# Patient Record
Sex: Female | Born: 1960 | Hispanic: No | Marital: Married | Smoking: Never smoker
Health system: Southern US, Community
[De-identification: ages and names within clinical notes are randomized; demographics above are authoritative.]

## PROBLEM LIST (undated history)

## (undated) DIAGNOSIS — D649 Anemia, unspecified: Secondary | ICD-10-CM

## (undated) DIAGNOSIS — E538 Deficiency of other specified B group vitamins: Secondary | ICD-10-CM

## (undated) DIAGNOSIS — B370 Candidal stomatitis: Secondary | ICD-10-CM

## (undated) DIAGNOSIS — K219 Gastro-esophageal reflux disease without esophagitis: Secondary | ICD-10-CM

## (undated) DIAGNOSIS — T7840XA Allergy, unspecified, initial encounter: Secondary | ICD-10-CM

## (undated) HISTORY — DX: Anemia, unspecified: D64.9

## (undated) HISTORY — DX: Candidal stomatitis: B37.0

## (undated) HISTORY — DX: Allergy, unspecified, initial encounter: T78.40XA

## (undated) HISTORY — DX: Gastro-esophageal reflux disease without esophagitis: K21.9

## (undated) HISTORY — PX: APPENDECTOMY: SHX54

## (undated) HISTORY — PX: UPPER GASTROINTESTINAL ENDOSCOPY: SHX188

## (undated) HISTORY — DX: Deficiency of other specified B group vitamins: E53.8

---

## 2010-02-16 ENCOUNTER — Encounter: Payer: Self-pay | Admitting: Internal Medicine

## 2010-03-21 ENCOUNTER — Encounter: Payer: Self-pay | Admitting: Internal Medicine

## 2010-03-28 ENCOUNTER — Encounter: Payer: Self-pay | Admitting: Internal Medicine

## 2010-04-03 ENCOUNTER — Encounter: Payer: Self-pay | Admitting: Internal Medicine

## 2010-04-09 ENCOUNTER — Encounter: Payer: Self-pay | Admitting: Internal Medicine

## 2010-04-16 ENCOUNTER — Encounter: Payer: Self-pay | Admitting: Internal Medicine

## 2010-04-18 ENCOUNTER — Encounter (INDEPENDENT_AMBULATORY_CARE_PROVIDER_SITE_OTHER): Payer: Self-pay | Admitting: *Deleted

## 2010-04-24 ENCOUNTER — Telehealth: Payer: Self-pay | Admitting: Internal Medicine

## 2010-04-26 DIAGNOSIS — K219 Gastro-esophageal reflux disease without esophagitis: Secondary | ICD-10-CM

## 2010-04-26 DIAGNOSIS — J301 Allergic rhinitis due to pollen: Secondary | ICD-10-CM | POA: Insufficient documentation

## 2010-04-26 DIAGNOSIS — D509 Iron deficiency anemia, unspecified: Secondary | ICD-10-CM

## 2010-04-26 DIAGNOSIS — B370 Candidal stomatitis: Secondary | ICD-10-CM | POA: Insufficient documentation

## 2010-04-26 DIAGNOSIS — R1319 Other dysphagia: Secondary | ICD-10-CM

## 2010-04-27 ENCOUNTER — Ambulatory Visit: Payer: Self-pay | Admitting: Internal Medicine

## 2010-05-10 ENCOUNTER — Ambulatory Visit: Payer: Self-pay | Admitting: Internal Medicine

## 2010-05-15 ENCOUNTER — Encounter: Payer: Self-pay | Admitting: Internal Medicine

## 2010-06-04 ENCOUNTER — Emergency Department (HOSPITAL_COMMUNITY): Admission: EM | Admit: 2010-06-04 | Discharge: 2010-06-04 | Payer: Self-pay | Admitting: Emergency Medicine

## 2010-06-04 ENCOUNTER — Encounter (INDEPENDENT_AMBULATORY_CARE_PROVIDER_SITE_OTHER): Payer: Self-pay | Admitting: *Deleted

## 2010-06-05 ENCOUNTER — Telehealth: Payer: Self-pay | Admitting: Internal Medicine

## 2010-06-07 ENCOUNTER — Ambulatory Visit (HOSPITAL_COMMUNITY): Admission: RE | Admit: 2010-06-07 | Discharge: 2010-06-07 | Payer: Self-pay | Admitting: Internal Medicine

## 2010-06-07 ENCOUNTER — Telehealth: Payer: Self-pay | Admitting: Internal Medicine

## 2010-06-14 ENCOUNTER — Ambulatory Visit: Payer: Self-pay | Admitting: Internal Medicine

## 2010-06-14 ENCOUNTER — Encounter (INDEPENDENT_AMBULATORY_CARE_PROVIDER_SITE_OTHER): Payer: Self-pay | Admitting: *Deleted

## 2010-06-19 ENCOUNTER — Ambulatory Visit: Payer: Self-pay | Admitting: Internal Medicine

## 2011-01-08 NOTE — Letter (Signed)
Summary: [name] MD  [name] MD   Imported By: Sherian Rein 04/26/2010 11:18:10  _____________________________________________________________________  External Attachment:    Type:   Image     Comment:   External Document

## 2011-01-08 NOTE — Procedures (Signed)
Summary: Upper Endoscopy  Patient: Jackie Powell Note: All result statuses are Final unless otherwise noted.  Tests: (1) Upper Endoscopy (EGD)   EGD Upper Endoscopy       DONE     [facility]     [address]     [place], [place]  [address]           ENDOSCOPY PROCEDURE REPORT           PATIENT:  Jackie Powell, Jackie Powell  MR#:  [id number]     BIRTHDATE:  [birth year], 49 yrs. old  GENDER:  female           ENDOSCOPIST:  Hedwig Morton. Juanda Chance, MD     Referred by:  Gareth Morgan, M.D.           PROCEDURE DATE:  06/19/2010     PROCEDURE:  EGD, diagnostic, Maloney Dilation of Esophagus     ASA CLASS:  Class I     INDICATIONS:  abnl Ba esophagram, B12 def, Iron def,     EGD 05/2010 with dil 49F helped for a short time           MEDICATIONS:   Versed 10 mg, Fentanyl 100 mcg     TOPICAL ANESTHETIC:  Exactacain Spray           DESCRIPTION OF PROCEDURE:   After the risks benefits and     alternatives of the procedure were thoroughly explained, informed     consent was obtained.  The [facility] GIF-H180 [id number] endoscope was     introduced through the mouth and advanced to the second portion of     the duodenum, without limitations.  The instrument was slowly     withdrawn as the mucosa was fully examined.     <<PROCEDUREIMAGES>>           The upper, middle, and distal third of the esophagus were     carefully inspected and no abnormalities were noted. The z-line     was well seen at the GEJ. The endoscope was pushed into the fundus     which was normal including a retroflexed view. The antrum,gastric     body, first and second part of the duodenum were unremarkable (see     image1, image2, image4, image3, image5, image6, image7, image8,     and image9).  maloney dilator 10F Elease Hashimoto passed without     difficulty    Retroflexed views revealed no abnormalities.    The     scope was then withdrawn from the patient and the procedure     completed.           COMPLICATIONS:  None        ENDOSCOPIC IMPRESSION:     1) Normal EGD     s/p passage of 10F maloney dilator     RECOMMENDATIONS:     post dil orders,     consider neuro consultation to r/o neurogenic dysphagia, brother     with ALS           REPEAT EXAM:  In 0 year(s) for.           ______________________________     Hedwig Morton. Juanda Chance, MD           CC:           n.     eSIGNED:   Hedwig Morton. Darey Hershberger at 06/19/2010 05:39 PM           Jackie Powell,  [id number]  Note: An exclamation mark (!) indicates a result that was not dispersed into the flowsheet. Document Creation Date: 06/19/2010 5:40 PM _______________________________________________________________________  (1) Order result status: Final Collection or observation date-time: 06/19/2010 16:51 Requested date-time:  Receipt date-time:  Reported date-time:  Referring Physician:   Ordering Physician: Lina Sar [phone]) Specimen Source:  Source: Launa Grill Order Number: [id number] Lab site:   Appended Document: Upper Endoscopy    Clinical Lists Changes

## 2011-01-08 NOTE — Assessment & Plan Note (Signed)
Summary: DYSPHAGIA...AS.   History of Present Illness Visit Type: consult  Primary GI MD: Lina Sar MD Primary Provider: Mila Homer. Sudie Bailey, MD Requesting Provider: Mila Homer. Sudie Bailey, MD Chief Complaint: GERD, dysphagia, nausea, and constipation  History of Present Illness:   This is a 50 year old white female who comes for solid food dysphagia and pill dysphagia as well as some chronic rhinitis followed by an ear,nose and throat specialist. She has had a 20 pound weight loss and severe iron deficiency anemia. Her hemoglobin was 9 and has gradually increased to 12.4 over the past 3 months. She has been on iron supplements. Patient has menorrhagia due to uterine fibroids. She is followed by a gynecologist. Her B12 level was 288 pg and her iron saturation was 7%. There is no family history of colon cancer. Patient has normal bowel habits and denies any bleeding. She also denies hoarseness or nocturnal cough.   GI Review of Systems    Reports acid reflux, dysphagia with solids, heartburn, and  nausea.      Denies abdominal pain, belching, bloating, chest pain, dysphagia with liquids, loss of appetite, vomiting, vomiting blood, weight loss, and  weight gain.      Reports constipation.     Denies anal fissure, black tarry stools, change in bowel habit, diarrhea, diverticulosis, fecal incontinence, heme positive stool, hemorrhoids, irritable bowel syndrome, jaundice, light color stool, liver problems, rectal bleeding, and  rectal pain.    Current Medications (verified): 1)  Ferretts 325 (106 Fe) Mg Tabs (Ferrous Fumarate) .... One Tablet By Mouth Once Daily 2)  Cyanocobalamin 1000 Mcg/ml Soln (Cyanocobalamin) .... Every Day 3)  Singulair 10 Mg Tabs (Montelukast Sodium) .... As Needed 4)  Afrin Nasal Spray 0.05 % Soln (Oxymetazoline Hcl) .... As Needed  Allergies (verified): 1)  ! Zithromax 2)  ! Codeine  Past History:  Past Medical History: Reviewed history from 04/25/2010 and no  changes required. ALLERGIC RHINITIS, SEASONAL (ICD-477.0) DYSPHAGIA (ICD-787.29) Hx of CANDIDIASIS, ORAL (ICD-112.0) GERD (ICD-530.81) ANEMIA, IRON DEFICIENCY (ICD-280.9)    Past Surgical History: Reviewed history from 04/25/2010 and no changes required. Unremarkable  Family History: No FH of Colon Cancer: Family History of Diabetes: PGGM Family History of Heart Disease: MGM, and Maternal Great Uncle   Social History: Self Employed Married Childern  Patient has never smoked.  Alcohol Use - no Illicit Drug Use - no Daily Caffeine Use: one daily   Review of Systems       The patient complains of allergy/sinus, anxiety-new, fatigue, heart rhythm changes, night sweats, shortness of breath, and sleeping problems.  The patient denies anemia, arthritis/joint pain, back pain, blood in urine, breast changes/lumps, change in vision, confusion, cough, coughing up blood, depression-new, fainting, fever, headaches-new, hearing problems, heart murmur, itching, menstrual pain, muscle pains/cramps, nosebleeds, pregnancy symptoms, skin rash, sore throat, swelling of feet/legs, swollen lymph glands, thirst - excessive , urination - excessive , urination changes/pain, urine leakage, vision changes, and voice change.         Pertinent positive and negative review of systems were noted in the above HPI. All other ROS was otherwise negative.   Vital Signs:  Patient profile:   50 year old female Height:      64 inches Weight:      146 pounds BMI:     25.15 BSA:     1.71 Pulse rate:   72 / minute Pulse rhythm:   regular BP sitting:   116 / 64  (left arm) Cuff size:  regular  Vitals Entered By: Ok Anis CMA (Apr 27, 2010 2:29 PM)  Physical Exam  General:  alert, oriented and in no distress. Eyes:  PERRLA, no icterus. Mouth:  No deformity or lesions, dentition normal. Neck:  Supple; no masses or thyromegaly. Lungs:  Clear throughout to auscultation. Heart:  Regular rate and rhythm; no  murmurs, rubs,  or bruits. Abdomen:  soft nontender abdomen with normoactive bowel sounds. Liver edge 1 cm below right costal margin. No distention. Rectal:  deferred due to patient having her period. Msk:  Symmetrical with no gross deformities. Normal posture. Extremities:  No clubbing, cyanosis, edema or deformities noted. Skin:  Intact without significant lesions or rashes. Psych:  Alert and cooperative. Normal mood and affect.   Impression & Recommendations:  Problem # 1:  ANEMIA, IRON DEFICIENCY (ICD-280.9)  Patient has severe iron deficiency anemia corrected by oral iron supplementation. This is most likely due to menorrhagia. Upper GI symptoms are suggestive of an esophageal stricture, esophageal dysmotility or esophagitis. In the setting of iron deficiency, we need to consider Plummer-Vinston syndrome or an esophageal web. She has a long history of intermittent gastroesophageal reflux . We will proceed with an upper endoscopy and possible dilatation. She will restart PPI's. I have given her samples of AcipHex 20 mg a day since it is a small pill and will pass easier than Prilosec, I have discussed screening for evaluation of iron deficiency anemia. The patient prefers to go throught with upper endoscopy first than we will colonoscopy depending on the findings.  Orders: EGD (EGD)  Problem # 2:  GERD (ICD-530.81)  Patient will need an EGD with possible dilatation. She is to start AcipHex 20 mg daily.  Orders: EGD (EGD)  Patient Instructions: 1)  AcipHex 20 mg daily 2)  Antireflux measures 3)  Continue iron supplements 4)  Upper endoscopy with possible biopsies and dilatation 5)  Consider screening colonoscopy by age 82 6)  Copy sent to :

## 2011-01-08 NOTE — Progress Notes (Signed)
Summary: Sooner Appt.   Phone Note Call from Patient Call back at [facility] Phone [phone]   Caller: Patient Summary of Call: Wants sooner appt. than 05-17-10. Appt. r/s to 04-27-10 at 2:45pm.  Norchel P. in [facility] will advise pt. of new appt. date/time. Pt. instructed to call back as needed.   Initial call taken by: Laureen Ochs LPN,  Apr 24, 2010 4:42 PM

## 2011-01-08 NOTE — Letter (Signed)
Summary: New Patient letter  [facility]  [address] [place], [place] [address]   Phone: [phone]  Fax: [phone]       04/18/2010 MRN: [id number]  Jackie Powell [address] [place], [place]  [address]  Dear Ms. Cibrian,  Welcome to the Gastroenterology Division at [facility].    You are scheduled to see Dr. Jarold Motto on 04/24/2010 at 9:00AM on the 3rd floor at [facility], [address]  We ask that you try to arrive at our office 15 minutes prior to your appointment time to allow for check-in.  We would like you to complete the enclosed self-administered evaluation form prior to your visit and bring it with you on the day of your appointment.  We will review it with you.  Also, please bring a complete list of all your medications or, if you prefer, bring the medication bottles and we will list them.  Please bring your insurance card so that we may make a copy of it.  If your insurance requires a referral to see a specialist, please bring your referral form from your primary care physician.  Co-payments are due at the time of your visit and may be paid by cash, check or credit card.     Your office visit will consist of a consult with your physician (includes a physical exam), any laboratory testing he/she may order, scheduling of any necessary diagnostic testing (e.g. x-ray, ultrasound, CT-scan), and scheduling of a procedure (e.g. Endoscopy, Colonoscopy) if required.  Please allow enough time on your schedule to allow for any/all of these possibilities.    If you cannot keep your appointment, please call [phone] to cancel or reschedule prior to your appointment date.  This allows [place] the opportunity to schedule an appointment for another patient in need of care.  If you do not cancel or reschedule by 5 p.m. the business day prior to your appointment date, you will be charged a $50.00 late cancellation/no-show fee.    Thank you for choosing [facility] for your medical needs.  We appreciate the opportunity to care for you.  Please visit [place] at our website  to learn more about our practice.                     Sincerely,                                                             The Gastroenterology Division

## 2011-01-08 NOTE — Letter (Signed)
Summary: [name] MD  [name] MD   Imported By: Sherian Rein 04/26/2010 11:17:12  _____________________________________________________________________  External Attachment:    Type:   Image     Comment:   External Document

## 2011-01-08 NOTE — Letter (Signed)
Summary: EGD Instructions  [facility]  [address] [place], [place] [address]   Phone: [phone]  Fax: [phone]       Jackie Powell    12-12-1960    MRN: [id number]       Procedure Day /Date:  06/19/10  Tuesday     Arrival Time:  3pm     Procedure Time: 4pm     Location of Procedure:                    _x  _  Endoscopy Center (4th Floor)   PREPARATION FOR ENDOSCOPY   On_ 06/19/10 _ THE DAY OF THE PROCEDURE:  1.   No solid foods, milk or milk products are allowed after midnight the night before your procedure.  2.   Do not drink anything colored red or purple.  Avoid juices with pulp.  No orange juice.  3.  You may drink clear liquids until 2pm, which is 2 hours before your procedure.                                                                                                CLEAR LIQUIDS INCLUDE: Water Jello Ice Popsicles Tea (sugar ok, no milk/cream) Powdered fruit flavored drinks Coffee (sugar ok, no milk/cream) Gatorade Juice: apple, white grape, white cranberry  Lemonade Clear bullion, consomm, broth Carbonated beverages (any kind) Strained chicken noodle soup Hard Candy   MEDICATION INSTRUCTIONS  Unless otherwise instructed, you should take regular prescription medications with a small sip of water as early as possible the morning of your procedure.               OTHER INSTRUCTIONS  You will need a responsible adult at least 49 years of age to accompany you and drive you home.   This person must remain in the waiting room during your procedure.  Wear loose fitting clothing that is easily removed.  Leave jewelry and other valuables at home.  However, you may wish to bring a book to read or an iPod/MP3 player to listen to music as you wait for your procedure to start.  Remove all body piercing jewelry and leave at home.  Total time from sign-in until discharge is approximately 2-3 hours.  You should go home  directly after your procedure and rest.  You can resume normal activities the day after your procedure.  The day of your procedure you should not:   Drive   Make legal decisions   Operate machinery   Drink alcohol   Return to work  You will receive specific instructions about eating, activities and medications before you leave.    The above instructions have been reviewed and explained to me by   Clide Cliff, RN______________________    I fully understand and can verbalize these instructions _____________________________ Date _________

## 2011-01-08 NOTE — Progress Notes (Signed)
Summary: persistent dysphagia, seen in ED//Ba Esophagrm Scheduled  Phone Note Other Incoming   Caller: ED Physician Summary of Call: Called by Dr. Patrica Duel last PM patient in ED with persistent dysphagia and also thought to be anxious. We will arrange follow-up for her....Dr. Juanda Chance to review and advise after nurse call to patient for further hx. Initial call taken by: Iva Boop MD, Clementeen Graham,  June 05, 2010 8:03 AM  Follow-up for Phone Call        Last OV 04-27-10, last Endo. 05-10-10. She took a few Achipex after her OV, but stopped them. Pt. states she improved, somewhat, for a few weeks after her Endoscopy, but symptoms have returned. She states the problem is when she initiates swallowing, not when things go down her esophagus.   1) Achipex two times a day for 5 days, then Daily. 2) Soft,bland diet. Be very careful with meats,rice and breads. 3) I will call pt. with new orders, after MD reviews.   Follow-up by: Laureen Ochs LPN,  June 05, 2010 10:04 AM  Additional Follow-up for Phone Call Additional follow up Details #1::        Please tell pt that I am suspecting a motility problem in addition to the es.web. For that reason, I would like her to have a Barium esophagram to assess the swollowing function as well as the motility and to make sure she does not need another dilation. Additional Follow-up by: Hart Carwin MD,  June 05, 2010 2:44 PM    Additional Follow-up for Phone Call Additional follow up Details #2::    Above MD orders reviewed with patient. Ba Esophagram is scheduled for 06-07-10 at 9am at [facility], arrive by 8:45am to register, no prep. Pt. instructed to call back as needed.    Follow-up by: Laureen Ochs LPN,  June 05, 2010 4:55 PM

## 2011-01-08 NOTE — Procedures (Signed)
Summary: Upper Endoscopy  Patient: [name] Note: All result statuses are Final unless otherwise noted.  Tests: (1) Upper Endoscopy (EGD)   EGD Upper Endoscopy       DONE     [facility]     [address]     [place], [place]  [address]           ENDOSCOPY PROCEDURE REPORT           PATIENT:  Jackie, Powell  MR#:  [id number]     BIRTHDATE:  February 04, 1961, 49 yrs. old  GENDER:  female           ENDOSCOPIST:  Hedwig Morton. Juanda Chance, MD     Referred by:  Gareth Morgan, M.D.           PROCEDURE DATE:  05/10/2010     PROCEDURE:  EGD with biopsy, Maloney Dilation of Esophagus     ASA CLASS:  Class I     INDICATIONS:  dysphagia, iron deficiency anemia, weight loss           MEDICATIONS:   Versed 7 mg, Fentanyl 50 mcg     TOPICAL ANESTHETIC:  Exactacain Spray           DESCRIPTION OF PROCEDURE:   After the risks benefits and     alternatives of the procedure were thoroughly explained, informed     consent was obtained.  The [facility] GIF-H180 [id number] endoscope was     introduced through the mouth and advanced to the second portion of     the duodenum, without limitations.  The instrument was slowly     withdrawn as the mucosa was fully examined.     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 at 15 cm, ? es. weg,     broken with an endoscope     r/o Gastric inlet patch, r/o Barrett's With standard forceps, a     biopsy was obtained and sent to pathology (see image9, image8, and     image7). maloney dilator 50F passed, there was blood on the     dilator  Otherwise the examination was normal. With standard     forceps, a biopsy was obtained and sent to pathology (see image3,     image4, image5, image6, image2, and image1). small bowl biopsy to     r/o sprue    Retroflexed views revealed no abnormalities.    The     scope was then withdrawn from the patient and the procedure     completed.           COMPLICATIONS:  None           ENDOSCOPIC  IMPRESSION:     1) Web in the proximal esophagus     2) Otherwise normal examination     s/p passage of 50F Maloney dilator     RECOMMENDATIONS:     1) Await biopsy results     continue Iron supplements, have H/H rechecked           REPEAT EXAM:  In 0 year(s) for.           ______________________________     Hedwig Morton. Juanda Chance, MD           CC:           n.     eSIGNED:   Hedwig Morton. Brodie at 05/10/2010 04:18 PM  Page 2 of 3   Jackie, Powell, [id number]  Note: An exclamation mark (!) indicates a result that was not dispersed into the flowsheet. Document Creation Date: 05/10/2010 4:18 PM _______________________________________________________________________  (1) Order result status: Final Collection or observation date-time: 05/10/2010 16:05 Requested date-time:  Receipt date-time:  Reported date-time:  Referring Physician:   Ordering Physician: Lina Sar [phone]) Specimen Source:  Source: Launa Grill Order Number: [id number] Lab site:

## 2011-01-08 NOTE — Letter (Signed)
Summary: Patient [facility] Biopsy Results  [facility]  [address] [place], [place] [address]   Phone: [phone]  Fax: [phone]        May 15, 2010 MRN: [id number]    SHAWNE BULOW [address] [place], [place]  [address]    Dear Ms. Creasey,  I am pleased to inform you that the biopsies taken during your recent endoscopic examination did not show any evidence of cancer upon pathologic examination.The tissue from Your esophagus  did not show any abnormality  Additional information/recommendations:  __No further action is needed at this time.  Please follow-up with      your primary care physician for your other healthcare needs.  _x_ Please call [phone] to schedule a return visit to review      your condition.  _x_ Continue with the treatment plan as outlined on the day of your      exam.  _   Please call us if you are having persistent problems or have questions about your condition that have not been fully answered at this time.  Sincerely,  Hart Carwin MD  This letter has been electronically signed by your physician.  Appended Document: Patient Notice-Endo Biopsy Results letter mailed.

## 2011-01-08 NOTE — Miscellaneous (Signed)
Summary: egd w/dil....dn  Clinical Lists Changes  Allergies: Changed allergy or adverse reaction from [facility] to [facility] Changed allergy or adverse reaction from CODEINE to NAPROSYN

## 2011-01-08 NOTE — Letter (Signed)
Summary: EGD Instructions  [facility]  [address] [place], [place] [address]   Phone: [phone]  Fax: [phone]       Jackie Powell    July 21, 1961    MRN: [id number]       Procedure Day /Date: 05/10/10 Thursday     Arrival Time: 2:30 pm     Procedure Time: 3:30 pm     Location of Procedure:                    _x  _ [facility] (4th Floor)  PREPARATION FOR ENDOSCOPY   On 05/10/10 THE DAY OF THE PROCEDURE:  1.   No solid foods, milk or milk products are allowed after midnight the night before your procedure.  2.   Do not drink anything colored red or purple.  Avoid juices with pulp.  No orange juice.  3.  You may drink clear liquids until 1:30 pm, which is 2 hours before your procedure.                                                                                                CLEAR LIQUIDS INCLUDE: Water Jello Ice Popsicles Tea (sugar ok, no milk/cream) Powdered fruit flavored drinks Coffee (sugar ok, no milk/cream) Gatorade Juice: apple, white grape, white cranberry  Lemonade Clear bullion, consomm, broth Carbonated beverages (any kind) Strained chicken noodle soup Hard Candy   MEDICATION INSTRUCTIONS  Unless otherwise instructed, you should take regular prescription medications with a small sip of water as early as possible the morning of your procedure.                  OTHER INSTRUCTIONS  You will need a responsible adult at least 49 years of age to accompany you and drive you home.   This person must remain in the waiting room during your procedure.  Wear loose fitting clothing that is easily removed.  Leave jewelry and other valuables at home.  However, you may wish to bring a book to read or an iPod/MP3 player to listen to music as you wait for your procedure to start.  Remove all body piercing jewelry and leave at home.  Total time from sign-in until discharge is approximately 2-3 hours.  You should go  home directly after your procedure and rest.  You can resume normal activities the day after your procedure.  The day of your procedure you should not:   Drive   Make legal decisions   Operate machinery   Drink alcohol   Return to work  You will receive specific instructions about eating, activities and medications before you leave.    The above instructions have been reviewed and explained to me by   Lamona Curl CMA Duncan Dull)  Apr 27, 2010 3:20 PM     I fully understand and can verbalize these instructions _____________________________ Date 04/27/10

## 2011-01-08 NOTE — Letter (Signed)
Summary: Date Range:02-09-10 to 04-09-10/Stephen Carlton Adam MD  Date Range:02-09-10 to 04-09-10/Stephen Carlton Adam MD   Imported By: Sherian Rein 04/26/2010 11:20:26  _____________________________________________________________________  External Attachment:    Type:   Image     Comment:   External Document

## 2011-01-08 NOTE — Progress Notes (Signed)
Summary: Next step  Phone Note Call from Patient Call back at Home Phone [phone]   Caller: Patient Summary of Call: pt called and would like to know what the next step is after her Barrium swallow this morning. I explained that Dr. Juanda Chance was in the hospital this week and once she reviews the scan her nurse will call her back about the next step. Initial call taken by: Harlow Mares CMA Duncan Dull),  June 07, 2010 3:16 PM  Follow-up for Phone Call        iwill call her in next few days to discuss. Follow-up by: Hart Carwin MD,  June 07, 2010 11:13 PM

## 2011-02-24 LAB — CBC
HCT: 38.6 % (ref 36.0–46.0)
Hemoglobin: 13 g/dL (ref 12.0–15.0)
MCH: 30.2 pg (ref 26.0–34.0)
RBC: 4.3 MIL/uL (ref 3.87–5.11)
RDW: 15.8 % — ABNORMAL HIGH (ref 11.5–15.5)

## 2011-02-24 LAB — DIFFERENTIAL
Basophils Absolute: 0.1 10*3/uL (ref 0.0–0.1)
Basophils Relative: 1 % (ref 0–1)
Lymphocytes Relative: 27 % (ref 12–46)
Lymphs Abs: 2.3 10*3/uL (ref 0.7–4.0)
Monocytes Relative: 8 % (ref 3–12)
Neutrophils Relative %: 63 % (ref 43–77)

## 2011-06-11 ENCOUNTER — Encounter: Payer: Self-pay | Admitting: *Deleted

## 2011-06-11 ENCOUNTER — Telehealth: Payer: Self-pay | Admitting: *Deleted

## 2011-06-11 NOTE — Telephone Encounter (Signed)
Called and left a message for patient to call me back.([phone]) Per Dr. Juanda Chance, schedule EGD with dil at [facility] for dysphagia.

## 2011-06-11 NOTE — Telephone Encounter (Signed)
Scheduled EGD with dil on 07/04/11 at 3:00 PM with 2:00 PM arrival. Pre visit on 06/26/11 at 3:30 PM

## 2011-06-13 ENCOUNTER — Ambulatory Visit (AMBULATORY_SURGERY_CENTER): Payer: PRIVATE HEALTH INSURANCE | Admitting: *Deleted

## 2011-06-13 DIAGNOSIS — R1310 Dysphagia, unspecified: Secondary | ICD-10-CM

## 2011-06-17 ENCOUNTER — Other Ambulatory Visit: Payer: Self-pay | Admitting: Internal Medicine

## 2011-06-17 ENCOUNTER — Ambulatory Visit (AMBULATORY_SURGERY_CENTER): Payer: PRIVATE HEALTH INSURANCE | Admitting: Internal Medicine

## 2011-06-17 ENCOUNTER — Telehealth: Payer: Self-pay | Admitting: Internal Medicine

## 2011-06-17 ENCOUNTER — Encounter: Payer: Self-pay | Admitting: Internal Medicine

## 2011-06-17 ENCOUNTER — Other Ambulatory Visit (INDEPENDENT_AMBULATORY_CARE_PROVIDER_SITE_OTHER): Payer: PRIVATE HEALTH INSURANCE

## 2011-06-17 VITALS — BP 158/88 | HR 85 | Temp 98.2°F | Resp 12 | Ht 64.0 in | Wt 153.0 lb

## 2011-06-17 DIAGNOSIS — D509 Iron deficiency anemia, unspecified: Secondary | ICD-10-CM

## 2011-06-17 DIAGNOSIS — R1310 Dysphagia, unspecified: Secondary | ICD-10-CM

## 2011-06-17 DIAGNOSIS — K222 Esophageal obstruction: Secondary | ICD-10-CM

## 2011-06-17 DIAGNOSIS — R1319 Other dysphagia: Secondary | ICD-10-CM

## 2011-06-17 DIAGNOSIS — K219 Gastro-esophageal reflux disease without esophagitis: Secondary | ICD-10-CM

## 2011-06-17 LAB — CBC WITH DIFFERENTIAL/PLATELET
Basophils Absolute: 0 10*3/uL (ref 0.0–0.1)
Eosinophils Relative: 0.6 % (ref 0.0–5.0)
Lymphocytes Relative: 20.5 % (ref 12.0–46.0)
Lymphs Abs: 1.2 10*3/uL (ref 0.7–4.0)
MCV: 91.7 fl (ref 78.0–100.0)
Monocytes Absolute: 0.4 10*3/uL (ref 0.1–1.0)
Monocytes Relative: 6.9 % (ref 3.0–12.0)
Platelets: 273 10*3/uL (ref 150.0–400.0)
RDW: 13.2 % (ref 11.5–14.6)
WBC: 5.8 10*3/uL (ref 4.5–10.5)

## 2011-06-17 LAB — IBC PANEL
Iron: 54 ug/dL (ref 42–145)
Transferrin: 216.7 mg/dL (ref 212.0–360.0)

## 2011-06-17 MED ORDER — OMEPRAZOLE 20 MG PO CPDR: 20.0000 mg | DELAYED_RELEASE_CAPSULE | Freq: Every day | ORAL | Status: DC

## 2011-06-17 MED ORDER — SODIUM CHLORIDE 0.9 % IV SOLN: 500.0000 mL | INTRAVENOUS | Status: DC

## 2011-06-17 NOTE — Patient Instructions (Signed)
Follow Dilatation Diet .Nothing to eat or drink anything until 1:15 p.m. Today. Clear liquid diet from 1:15 to 2:15 p.m. Today. After 2:15 today soft foods for the remainder of the day.  Continue your medications. New medication prescription is Prilosec 20 mg daily.  Blood work today in basement.  Call Dr. Juanda Chance in two weeks to inform of your condition.

## 2011-06-17 NOTE — Telephone Encounter (Signed)
Returned pts phone call and spoke with her mother Irving Burton.  Her mother states that she took 2 motrin last night and 2 this morning for cramps from her period.  States that she was told she could not have procedure if she took motrin.  Spoke with Dr Juanda Chance and she states that pt can have procedure despite taking motrin.  Called pt back and advised her that pt can have procedure today.

## 2011-06-17 NOTE — Progress Notes (Signed)
Pt taken to the lab in the basement on discharge for blood work.  No complaints on discharge. MAW

## 2011-06-18 ENCOUNTER — Telehealth: Payer: Self-pay | Admitting: *Deleted

## 2011-06-18 ENCOUNTER — Other Ambulatory Visit (HOSPITAL_COMMUNITY): Payer: Self-pay | Admitting: Internal Medicine

## 2011-06-18 DIAGNOSIS — E538 Deficiency of other specified B group vitamins: Secondary | ICD-10-CM

## 2011-06-18 DIAGNOSIS — E611 Iron deficiency: Secondary | ICD-10-CM

## 2011-06-18 NOTE — Telephone Encounter (Signed)
No answer

## 2011-06-18 NOTE — Telephone Encounter (Signed)
Message copied by Daphine Deutscher on Tue Jun 18, 2011 10:40 AM ------      Message from: Hart Carwin      Created: Mon Jun 17, 2011  9:53 PM       In addition, please schedule pt for Modified Barium swallow with Speech pathologist to include  Swallowing of a 13 mm tablet which pt refused  during 05/2010 Barium esophagram, " assess initiation of swallow"

## 2011-06-18 NOTE — Telephone Encounter (Signed)
Spoke with patient and gave her Dr. Regino Schultze recommendations and appointment time for modified barium swallow. Labs in [facility] for 09/18/11. Note to remind patient

## 2011-06-18 NOTE — Telephone Encounter (Signed)
Notes Recorded by Hart Carwin, MD on 06/17/2011 at 9:49 PM Please call pt with slightly low H/H and Iron. Low normal B12. Please continue Iron supplements daily and recheck Iron and B12 in 3 months.

## 2011-06-18 NOTE — Telephone Encounter (Signed)
Patient scheduled for modified barium swallow on 07/02/11 at [facility] radiology arrive at 9:45 AM for 10:00 AM procedure(Connie)

## 2011-06-19 ENCOUNTER — Encounter: Payer: Self-pay | Admitting: *Deleted

## 2011-07-02 ENCOUNTER — Other Ambulatory Visit (HOSPITAL_COMMUNITY): Payer: PRIVATE HEALTH INSURANCE

## 2011-07-02 ENCOUNTER — Ambulatory Visit (HOSPITAL_COMMUNITY): Payer: PRIVATE HEALTH INSURANCE

## 2011-07-04 ENCOUNTER — Encounter: Payer: Self-pay | Admitting: Internal Medicine

## 2011-07-05 ENCOUNTER — Other Ambulatory Visit (HOSPITAL_COMMUNITY): Payer: Self-pay | Admitting: Internal Medicine

## 2011-07-12 ENCOUNTER — Ambulatory Visit: Payer: Self-pay | Admitting: Internal Medicine

## 2011-07-24 ENCOUNTER — Ambulatory Visit (HOSPITAL_COMMUNITY): Payer: PRIVATE HEALTH INSURANCE

## 2011-07-24 ENCOUNTER — Other Ambulatory Visit (HOSPITAL_COMMUNITY): Payer: PRIVATE HEALTH INSURANCE

## 2011-07-25 ENCOUNTER — Other Ambulatory Visit (HOSPITAL_COMMUNITY): Payer: Self-pay | Admitting: Internal Medicine

## 2011-07-29 ENCOUNTER — Ambulatory Visit: Payer: Self-pay | Admitting: Internal Medicine

## 2011-08-01 ENCOUNTER — Inpatient Hospital Stay (HOSPITAL_COMMUNITY): Admission: RE | Admit: 2011-08-01 | Payer: PRIVATE HEALTH INSURANCE | Source: Ambulatory Visit

## 2011-08-01 ENCOUNTER — Telehealth: Payer: Self-pay | Admitting: Internal Medicine

## 2011-08-01 ENCOUNTER — Ambulatory Visit (HOSPITAL_COMMUNITY): Payer: PRIVATE HEALTH INSURANCE | Attending: Internal Medicine

## 2011-08-01 NOTE — Telephone Encounter (Signed)
Called and left a message for patient with radiology scheduling number for her to r/s barium swallow

## 2011-08-09 ENCOUNTER — Telehealth: Payer: Self-pay | Admitting: *Deleted

## 2011-08-09 NOTE — Telephone Encounter (Signed)
Left a message for patient to call me about the cancelled modified barium swallow and if she plans to reschedule this.

## 2011-08-13 NOTE — Telephone Encounter (Signed)
Spoke with patient and she plans to have this test prior to OV on 08/28/11 with Dr. Juanda Chance. States she is calling today to r/s the appointment.

## 2011-08-19 ENCOUNTER — Telehealth: Payer: Self-pay | Admitting: *Deleted

## 2011-08-19 NOTE — Telephone Encounter (Signed)
Left a message for patient to call me re: mod. Barium Swallow that she has not rescheduled.

## 2011-08-20 NOTE — Telephone Encounter (Signed)
Left message for patient to call me

## 2011-08-22 NOTE — Telephone Encounter (Signed)
Spoke with patient and she states her Down's Syndrome daughter has had problems with her school, Patient states this has kept her busy for the last 3 weeks. She hasn't been able to get rescheduled but she will call today and let me know if we need to reschedule the OV.

## 2011-08-26 NOTE — Telephone Encounter (Signed)
Left a message for patient to call me and let me know if I should cancel her appointment for 08/28/11 since she has not gotten her mod. Barium swallow done.

## 2011-08-27 NOTE — Telephone Encounter (Signed)
Called and left a message for patient that I am cancelling her appointment tomorrow since she has not had her modified barium swallow done.

## 2011-08-28 ENCOUNTER — Ambulatory Visit: Payer: Self-pay | Admitting: Internal Medicine

## 2011-09-09 ENCOUNTER — Encounter: Payer: Self-pay | Admitting: *Deleted

## 2011-09-13 ENCOUNTER — Telehealth: Payer: Self-pay | Admitting: *Deleted

## 2011-09-13 NOTE — Telephone Encounter (Signed)
Message copied by Daphine Deutscher on Fri Sep 13, 2011  9:17 AM ------      Message from: Daphine Deutscher      Created: Tue Jun 18, 2011  3:24 PM       Patient due for IBC panel and b12 on 09/18/11(DB) call and remind

## 2011-09-13 NOTE — Telephone Encounter (Signed)
Spoke with patient and reminded her of labs that are due on 09/18/11

## 2011-09-26 ENCOUNTER — Telehealth: Payer: Self-pay | Admitting: *Deleted

## 2011-09-26 NOTE — Telephone Encounter (Signed)
Left a message for patient that she needs to have labs drawn as previously discussed.

## 2011-09-26 NOTE — Telephone Encounter (Signed)
Message copied by Daphine Deutscher on Thu Sep 26, 2011  1:47 PM ------      Message from: Daphine Deutscher      Created: Fri Sep 13, 2011  9:19 AM       Did pt get IBC and B12 for DB(10/10)

## 2011-10-01 ENCOUNTER — Encounter: Payer: Self-pay | Admitting: *Deleted

## 2011-10-01 NOTE — Telephone Encounter (Signed)
Patient has not returned several calls re: follow up lab work. Mailed patient a letter.

## 2011-10-01 NOTE — Telephone Encounter (Signed)
Patient has not gotten lab work drawn after several phone calls to remind her. Letter mailed to patient

## 2011-10-02 NOTE — Telephone Encounter (Signed)
Reviewed, DB

## 2012-07-24 ENCOUNTER — Other Ambulatory Visit (HOSPITAL_COMMUNITY): Payer: Self-pay | Admitting: Family Medicine

## 2012-07-24 DIAGNOSIS — R109 Unspecified abdominal pain: Secondary | ICD-10-CM

## 2012-07-27 ENCOUNTER — Other Ambulatory Visit (HOSPITAL_COMMUNITY): Payer: PRIVATE HEALTH INSURANCE

## 2012-07-30 ENCOUNTER — Ambulatory Visit (HOSPITAL_COMMUNITY): Payer: PRIVATE HEALTH INSURANCE

## 2014-06-19 ENCOUNTER — Encounter (HOSPITAL_COMMUNITY): Payer: BC Managed Care – PPO | Admitting: Anesthesiology

## 2014-06-19 ENCOUNTER — Encounter (HOSPITAL_COMMUNITY)
Admission: EM | Disposition: A | Discharge status: Home / Self Care | Payer: Self-pay | Source: Home / Self Care | Attending: Emergency Medicine

## 2014-06-19 ENCOUNTER — Encounter (HOSPITAL_COMMUNITY): Payer: Self-pay | Admitting: Emergency Medicine

## 2014-06-19 ENCOUNTER — Observation Stay (HOSPITAL_COMMUNITY)
Admission: EM | Admit: 2014-06-19 | Discharge: 2014-06-20 | Disposition: A | Discharge status: Home / Self Care | Payer: BC Managed Care – PPO | Attending: Surgery | Admitting: Surgery

## 2014-06-19 ENCOUNTER — Emergency Department (HOSPITAL_COMMUNITY): Payer: BC Managed Care – PPO

## 2014-06-19 ENCOUNTER — Emergency Department (HOSPITAL_COMMUNITY): Payer: BC Managed Care – PPO | Admitting: Anesthesiology

## 2014-06-19 DIAGNOSIS — R112 Nausea with vomiting, unspecified: Secondary | ICD-10-CM

## 2014-06-19 DIAGNOSIS — K3580 Unspecified acute appendicitis: Principal | ICD-10-CM | POA: Insufficient documentation

## 2014-06-19 DIAGNOSIS — K219 Gastro-esophageal reflux disease without esophagitis: Secondary | ICD-10-CM | POA: Insufficient documentation

## 2014-06-19 DIAGNOSIS — K37 Unspecified appendicitis: Secondary | ICD-10-CM | POA: Diagnosis present

## 2014-06-19 DIAGNOSIS — R109 Unspecified abdominal pain: Secondary | ICD-10-CM

## 2014-06-19 HISTORY — PX: LAPAROSCOPIC APPENDECTOMY: SHX408

## 2014-06-19 LAB — URINE MICROSCOPIC-ADD ON

## 2014-06-19 LAB — URINALYSIS, ROUTINE W REFLEX MICROSCOPIC
Glucose, UA: NEGATIVE mg/dL
KETONES UR: 40 mg/dL — AB
NITRITE: NEGATIVE
Protein, ur: NEGATIVE mg/dL
Specific Gravity, Urine: 1.023 (ref 1.005–1.030)
UROBILINOGEN UA: 1 mg/dL (ref 0.0–1.0)
pH: 5.5 (ref 5.0–8.0)

## 2014-06-19 LAB — COMPREHENSIVE METABOLIC PANEL
ALBUMIN: 3.4 g/dL — AB (ref 3.5–5.2)
ALK PHOS: 62 U/L (ref 39–117)
ALT: 15 U/L (ref 0–35)
ANION GAP: 12 (ref 5–15)
AST: 14 U/L (ref 0–37)
BUN: 9 mg/dL (ref 6–23)
CO2: 26 mEq/L (ref 19–32)
Calcium: 9.2 mg/dL (ref 8.4–10.5)
Chloride: 99 mEq/L (ref 96–112)
Creatinine, Ser: 0.6 mg/dL (ref 0.50–1.10)
GFR calc Af Amer: >90 mL/min (ref >90)
GFR calc non Af Amer: >90 mL/min (ref >90)
Glucose, Bld: 106 mg/dL — ABNORMAL HIGH (ref 70–99)
POTASSIUM: 3.2 meq/L — AB (ref 3.7–5.3)
SODIUM: 137 meq/L (ref 137–147)
TOTAL PROTEIN: 7.5 g/dL (ref 6.0–8.3)
Total Bilirubin: 1.3 mg/dL — ABNORMAL HIGH (ref 0.3–1.2)

## 2014-06-19 LAB — CBC WITH DIFFERENTIAL/PLATELET
BASOS PCT: 0 % (ref 0–1)
Basophils Absolute: 0 10*3/uL (ref 0.0–0.1)
Eosinophils Absolute: 0 10*3/uL (ref 0.0–0.7)
Eosinophils Relative: 0 % (ref 0–5)
HCT: 37.7 % (ref 36.0–46.0)
Hemoglobin: 12.5 g/dL (ref 12.0–15.0)
Lymphocytes Relative: 3 % — ABNORMAL LOW (ref 12–46)
Lymphs Abs: 0.6 10*3/uL — ABNORMAL LOW (ref 0.7–4.0)
MCH: 29.2 pg (ref 26.0–34.0)
MCHC: 33.2 g/dL (ref 30.0–36.0)
MCV: 88.1 fL (ref 78.0–100.0)
Monocytes Absolute: 1.1 10*3/uL — ABNORMAL HIGH (ref 0.1–1.0)
Monocytes Relative: 5 % (ref 3–12)
NEUTROS PCT: 92 % — AB (ref 43–77)
Neutro Abs: 18.7 10*3/uL — ABNORMAL HIGH (ref 1.7–7.7)
PLATELETS: 234 10*3/uL (ref 150–400)
RBC: 4.28 MIL/uL (ref 3.87–5.11)
RDW: 13.3 % (ref 11.5–15.5)
WBC: 20.3 10*3/uL — ABNORMAL HIGH (ref 4.0–10.5)

## 2014-06-19 LAB — LIPASE, BLOOD: Lipase: 17 U/L (ref 11–59)

## 2014-06-19 LAB — POC URINE PREG, ED: PREG TEST UR: NEGATIVE

## 2014-06-19 SURGERY — APPENDECTOMY, LAPAROSCOPIC
Anesthesia: General

## 2014-06-19 MED ORDER — BUPIVACAINE HCL (PF) 0.25 % IJ SOLN
INTRAMUSCULAR | Status: AC
  Filled 2014-06-19: qty 30

## 2014-06-19 MED ORDER — IOHEXOL 300 MG/ML  SOLN
100.0000 mL | Freq: Once | INTRAMUSCULAR | Status: AC | PRN
  Administered 2014-06-19: 100 mL via INTRAVENOUS

## 2014-06-19 MED ORDER — MIDAZOLAM HCL 5 MG/5ML IJ SOLN
INTRAMUSCULAR | Status: DC | PRN
  Administered 2014-06-19: 2 mg via INTRAVENOUS

## 2014-06-19 MED ORDER — LIDOCAINE HCL (CARDIAC) 20 MG/ML IV SOLN
INTRAVENOUS | Status: AC
  Filled 2014-06-19: qty 5

## 2014-06-19 MED ORDER — LACTATED RINGERS IV SOLN
INTRAVENOUS | Status: DC | PRN
  Administered 2014-06-19: 23:00:00 via INTRAVENOUS

## 2014-06-19 MED ORDER — LACTATED RINGERS IR SOLN
Status: DC | PRN
  Administered 2014-06-19: 1000 mL

## 2014-06-19 MED ORDER — IOHEXOL 300 MG/ML  SOLN
50.0000 mL | Freq: Once | INTRAMUSCULAR | Status: AC | PRN
  Administered 2014-06-19: 50 mL via ORAL

## 2014-06-19 MED ORDER — LIDOCAINE HCL (CARDIAC) 20 MG/ML IV SOLN
INTRAVENOUS | Status: DC | PRN
  Administered 2014-06-19: 100 mg via INTRAVENOUS

## 2014-06-19 MED ORDER — FENTANYL CITRATE 0.05 MG/ML IJ SOLN
INTRAMUSCULAR | Status: AC
  Filled 2014-06-19: qty 5

## 2014-06-19 MED ORDER — BUPIVACAINE HCL (PF) 0.25 % IJ SOLN
INTRAMUSCULAR | Status: DC | PRN
  Administered 2014-06-19: 30 mL

## 2014-06-19 MED ORDER — DEXAMETHASONE SODIUM PHOSPHATE 10 MG/ML IJ SOLN
INTRAMUSCULAR | Status: DC | PRN
  Administered 2014-06-19: 10 mg via INTRAVENOUS

## 2014-06-19 MED ORDER — FENTANYL CITRATE 0.05 MG/ML IJ SOLN
INTRAMUSCULAR | Status: DC | PRN
  Administered 2014-06-19: 100 ug via INTRAVENOUS
  Administered 2014-06-19 (×2): 50 ug via INTRAVENOUS

## 2014-06-19 MED ORDER — ONDANSETRON HCL 4 MG/2ML IJ SOLN
4.0000 mg | Freq: Once | INTRAMUSCULAR | Status: AC
Start: 2014-06-19 — End: 2014-06-19
  Administered 2014-06-19: 4 mg via INTRAVENOUS
  Filled 2014-06-19: qty 2

## 2014-06-19 MED ORDER — PROPOFOL 10 MG/ML IV BOLUS
INTRAVENOUS | Status: DC | PRN
  Administered 2014-06-19: 170 mg via INTRAVENOUS

## 2014-06-19 MED ORDER — PIPERACILLIN-TAZOBACTAM 3.375 G IVPB 30 MIN
INTRAVENOUS | Status: DC | PRN
  Administered 2014-06-19: 3.375 g via INTRAVENOUS

## 2014-06-19 MED ORDER — SODIUM CHLORIDE 0.9 % IV SOLN
1000.0000 mL | Freq: Once | INTRAVENOUS | Status: AC
  Administered 2014-06-19: 1000 mL via INTRAVENOUS

## 2014-06-19 MED ORDER — PROPOFOL 10 MG/ML IV BOLUS
INTRAVENOUS | Status: AC
  Filled 2014-06-19: qty 20

## 2014-06-19 MED ORDER — ONDANSETRON HCL 4 MG/2ML IJ SOLN
INTRAMUSCULAR | Status: AC
  Filled 2014-06-19: qty 2

## 2014-06-19 MED ORDER — ONDANSETRON HCL 4 MG/2ML IJ SOLN
INTRAMUSCULAR | Status: DC | PRN
  Administered 2014-06-19: 4 mg via INTRAVENOUS

## 2014-06-19 MED ORDER — DEXAMETHASONE SODIUM PHOSPHATE 10 MG/ML IJ SOLN
INTRAMUSCULAR | Status: AC
  Filled 2014-06-19: qty 1

## 2014-06-19 MED ORDER — MIDAZOLAM HCL 2 MG/2ML IJ SOLN
INTRAMUSCULAR | Status: AC
  Filled 2014-06-19: qty 2

## 2014-06-19 MED ORDER — ROCURONIUM BROMIDE 100 MG/10ML IV SOLN
INTRAVENOUS | Status: AC
  Filled 2014-06-19: qty 1

## 2014-06-19 MED ORDER — ROCURONIUM BROMIDE 100 MG/10ML IV SOLN
INTRAVENOUS | Status: DC | PRN
  Administered 2014-06-19: 20 mg via INTRAVENOUS

## 2014-06-19 MED ORDER — PIPERACILLIN-TAZOBACTAM 3.375 G IVPB
INTRAVENOUS | Status: AC
  Filled 2014-06-19: qty 50

## 2014-06-19 MED ORDER — SUCCINYLCHOLINE CHLORIDE 20 MG/ML IJ SOLN
INTRAMUSCULAR | Status: DC | PRN
Start: 2014-06-19 — End: 2014-06-20
  Administered 2014-06-19: 100 mg via INTRAVENOUS

## 2014-06-19 MED ORDER — HYDROMORPHONE HCL PF 1 MG/ML IJ SOLN
0.5000 mg | INTRAMUSCULAR | Status: DC | PRN
  Administered 2014-06-19: 0.5 mg via INTRAVENOUS
  Filled 2014-06-19: qty 1

## 2014-06-19 MED ORDER — SODIUM CHLORIDE 0.9 % IV SOLN
1000.0000 mL | INTRAVENOUS | Status: DC
  Administered 2014-06-19: 1000 mL via INTRAVENOUS

## 2014-06-19 MED ORDER — 0.9 % SODIUM CHLORIDE (POUR BTL) OPTIME
TOPICAL | Status: DC | PRN
  Administered 2014-06-19: 1000 mL

## 2014-06-19 SURGICAL SUPPLY — 39 items
APPLIER CLIP ROT 10 11.4 M/L (STAPLE)
BENZOIN TINCTURE PRP APPL 2/3 (GAUZE/BANDAGES/DRESSINGS) ×3 IMPLANT
CANISTER SUCTION 2500CC (MISCELLANEOUS) ×3 IMPLANT
CLIP APPLIE ROT 10 11.4 M/L (STAPLE) IMPLANT
CLOSURE WOUND 1/2 X4 (GAUZE/BANDAGES/DRESSINGS) ×1
CUTTER FLEX LINEAR 45M (STAPLE) ×3 IMPLANT
DECANTER SPIKE VIAL GLASS SM (MISCELLANEOUS) ×3 IMPLANT
DERMABOND ADVANCED (GAUZE/BANDAGES/DRESSINGS) ×2
DERMABOND ADVANCED .7 DNX12 (GAUZE/BANDAGES/DRESSINGS) ×1 IMPLANT
DRAPE LAPAROSCOPIC ABDOMINAL (DRAPES) ×3 IMPLANT
ELECT REM PT RETURN 9FT ADLT (ELECTROSURGICAL) ×3
ELECTRODE REM PT RTRN 9FT ADLT (ELECTROSURGICAL) ×1 IMPLANT
ENDOLOOP SUT PDS II  0 18 (SUTURE)
ENDOLOOP SUT PDS II 0 18 (SUTURE) IMPLANT
GLOVE BIOGEL PI IND STRL 7.0 (GLOVE) ×1 IMPLANT
GLOVE BIOGEL PI INDICATOR 7.0 (GLOVE) ×2
GLOVE SURG SIGNA 7.5 PF LTX (GLOVE) ×3 IMPLANT
GOWN SPEC L4 XLG W/TWL (GOWN DISPOSABLE) ×3 IMPLANT
GOWN STRL REUS W/ TWL XL LVL3 (GOWN DISPOSABLE) ×3 IMPLANT
GOWN STRL REUS W/TWL LRG LVL3 (GOWN DISPOSABLE) ×3 IMPLANT
GOWN STRL REUS W/TWL XL LVL3 (GOWN DISPOSABLE) ×6
KIT BASIN OR (CUSTOM PROCEDURE TRAY) ×3 IMPLANT
PENCIL BUTTON HOLSTER BLD 10FT (ELECTRODE) IMPLANT
POUCH SPECIMEN RETRIEVAL 10MM (ENDOMECHANICALS) ×3 IMPLANT
RELOAD 45 VASCULAR/THIN (ENDOMECHANICALS) IMPLANT
RELOAD STAPLE TA45 3.5 REG BLU (ENDOMECHANICALS) ×3 IMPLANT
SET IRRIG TUBING LAPAROSCOPIC (IRRIGATION / IRRIGATOR) ×3 IMPLANT
SHEARS HARMONIC ACE PLUS 36CM (ENDOMECHANICALS) ×3 IMPLANT
SOLUTION ANTI FOG 6CC (MISCELLANEOUS) ×3 IMPLANT
STRIP CLOSURE SKIN 1/2X4 (GAUZE/BANDAGES/DRESSINGS) ×2 IMPLANT
SUT MON AB 5-0 PS2 18 (SUTURE) ×3 IMPLANT
TOWEL OR 17X26 10 PK STRL BLUE (TOWEL DISPOSABLE) ×3 IMPLANT
TOWEL OR NON WOVEN STRL DISP B (DISPOSABLE) ×3 IMPLANT
TRAY FOLEY CATH 14FRSI W/METER (CATHETERS) ×3 IMPLANT
TRAY LAP CHOLE (CUSTOM PROCEDURE TRAY) ×3 IMPLANT
TROCAR BLADELESS OPT 5 100 (ENDOMECHANICALS) ×3 IMPLANT
TROCAR XCEL BLUNT TIP 100MML (ENDOMECHANICALS) ×3 IMPLANT
TROCAR XCEL NON-BLD 11X100MML (ENDOMECHANICALS) ×3 IMPLANT
TUBING INSUFFLATION 10FT LAP (TUBING) ×3 IMPLANT

## 2014-06-19 NOTE — ED Notes (Signed)
Pt ambulated to BR and back to room with NT, Annieah

## 2014-06-19 NOTE — ED Notes (Signed)
Pt c/o NV abd pain since Friday night.  [place] and dx w/ "stomach bug".  Still vomiting.

## 2014-06-19 NOTE — Op Note (Signed)
Re:   Jackie Powell DOB:   04/18/1961 MRN:   [id number]                   FACILITY[facility]  [facility]  DATE OF PROCEDURE: 06/19/2014                              OPERATIVE REPORT  PREOPERATIVE DIAGNOSIS:  Appendicitis  POSTOPERATIVE DIAGNOSIS:  Acute appendicitis, gangrenous and perforated.  PROCEDURE:  Laparoscopic appendectomy.  SURGEON:  Sandria Balesavid H. Ezzard StandingNewman, MD  ASSISTANT:  No first assistant.  ANESTHESIA:  General endotracheal.  Anesthesiologist: Einar PheasantAlexander F Fortune, MD CRNA: Doran ClayStephen R Alday, CRNA  ASA:  2E  ESTIMATED BLOOD LOSS:  Minimal.  DRAINS: none   SPECIMEN:   Appendix  COUNTS CORRECT:  YES  INDICATIONS FOR PROCEDURE: Jackie Powell is a 53 y.o. (DOB: 07/18/1961) white  female whose primary care doctor is Milana ObeyKNOWLTON,STEPHEN D, MD and comes to the OR for an appendectomy.   I discussed with the patient, the indications and potential complications of appendiceal surgery.  The potential complications include, but are not limited to, bleeding, open surgery, bowel resection, and the possibility of another diagnosis.  OPERATIVE NOTE:  The patient underwent a general endotracheal anesthetic as supervised by Anesthesiologist: Einar PheasantAlexander F Fortune, MD CRNA: Doran ClayStephen R Alday, CRNA, General, in room #1.  The patient was given Zosyn at the beginning of the procedure and the abdomen was prepped with ChloraPrep.  The patient had a foley catheter placed at the beginning of the procedure.  A time-out was held and surgical checklist run.  An infraumbilical incision was made with sharp dissection carried down to the abdominal cavity.  An 12 mm Hasson trocar was inserted through the infraumbilical incision and into the peritoneal cavity.  A 0 degree 10 mm laparoscope was inserted through a 12 mm Hasson trocar and the Hasson trocar secured with a 0 Vicryl suture.  I placed a 5 mm trocar in the right upper quadrant and 5 mm torcar in left lower quadrant and did abdominal exploration.    The right and left  lobes of liver unremarkable.  Stomach was unremarkable.  The pelvic organs were unremarkable.  I saw no other intra-abdominal abnormality.  The patient had appendicitis with the appendix located at the right pelvic brim.  It was "behind" the terminal ileum, was almost totally gangrenous, and focally perforated.  The mesentery of the appendix was divided with a Harmonic scalpel.  I got to the base of the appendix which was viable and I thought could hold staples.  I then used a blue load 45 mm Ethicon Endo-GIA stapler and fired this across the base of the appendix.  I placed the appendix in EndoCatch bag and delivered the bag through the umbilical incision.  I irrigated the abdomen with 2,000 cc of saline.  After irrigating the abdomen, I then removed the trocars, in turn.  The umbilical port fascia was closed with 0 Vicryl suture.   I closed the skin each site with a 5-0 Vicryl suture and painted the wounds with Dermabond.  I then injected a total of 20 mL of 0.25% Marcaine at the incisions.  Sponge and needle count were correct at the end of the case.  The foley catheter was removed in the OR.  The patient was transferred to the recovery room in good condition.  The patient tolerated the procedure well and it depends on the patient's post op  clinical course as to when the patient could be discharged.   Ovidio Kin, MD, [facility] Surgery Pager: [phone] Office phone:  [phone]

## 2014-06-19 NOTE — Anesthesia Preprocedure Evaluation (Signed)
Anesthesia Evaluation  Patient identified by MRN, date of birth, ID band Patient awake    Reviewed: Allergy & Precautions, H&P , NPO status , Patient's Chart, lab work & pertinent test results  Airway Mallampati: II TM Distance: >3 FB Neck ROM: Full    Dental  (+) Teeth Intact, Dental Advisory Given   Pulmonary neg pulmonary ROS,  breath sounds clear to auscultation  Pulmonary exam normal       Cardiovascular negative cardio ROS  Rhythm:Regular Rate:Normal     Neuro/Psych negative neurological ROS  negative psych ROS   GI/Hepatic negative GI ROS, Neg liver ROS, GERD-  ,  Endo/Other  negative endocrine ROS  Renal/GU negative Renal ROS  negative genitourinary   Musculoskeletal negative musculoskeletal ROS (+)   Abdominal   Peds negative pediatric ROS (+)  Hematology negative hematology ROS (+)   Anesthesia Other Findings   Reproductive/Obstetrics negative OB ROS                           Anesthesia Physical Anesthesia Plan  ASA: I and emergent  Anesthesia Plan: General   Post-op Pain Management:    Induction: Intravenous, Rapid sequence and Cricoid pressure planned  Airway Management Planned: Oral ETT  Additional Equipment:   Intra-op Plan:   Post-operative Plan: Extubation in OR  Informed Consent: I have reviewed the patients History and Physical, chart, labs and discussed the procedure including the risks, benefits and alternatives for the proposed anesthesia with the patient or authorized representative who has indicated his/her understanding and acceptance.   Dental advisory given  Plan Discussed with: CRNA  Anesthesia Plan Comments:         Anesthesia Quick Evaluation

## 2014-06-19 NOTE — ED Notes (Signed)
Patient upset r/t wait time, states that she has a special needs child at home and that she would like to come back tomorrow Results of CT noted by this nurse Patient informed that she still needs to be seen by EDP to discuss CT results and that if she wants to leave prior to speaking with him, that she would have to sign out AMA [facility]  Patient states that she will continue to wait for EDP  Patient ambulating in hallways without difficulty or nursing staff assistance Patient in NAD

## 2014-06-19 NOTE — ED Notes (Signed)
Pt ambulated to BR and back to room w/o assistance. Pt is A&O and in NAD

## 2014-06-19 NOTE — ED Notes (Signed)
MD at bedside. 

## 2014-06-19 NOTE — ED Notes (Signed)
Pt ambulated to BR and back to room w/o assistance 

## 2014-06-19 NOTE — H&P (Signed)
Re:   Jackie Powell DOB:   [birth year] MRN:   [id number]\  [facility] Admission  ASSESSMENT AND PLAN: 1.  Appendicitis  I discussed with the patient the indications and risks of appendiceal surgery.  The primary risks of appendiceal surgery include, but are not limited to, bleeding, infection, bowel surgery, and open surgery.  There is also the risk that the patient may have continued symptoms after surgery.  However, the likelihood of improvement in symptoms and return to the patient's normal status is good. We discussed the typical post-operative recovery course. I tried to answer the patient's questions.  2.  GERD - had esophageal stricture.  Takes an occasional Prilosec. 3.  Gallstones  Questionably causing her dyspepsia over the last several months.  Chief Complaint  Patient presents with  . Nausea  . Emesis  . Abdominal Pain   REFERRING PHYSICIAN:  Dr. Roselyn Bering, [facility]  HISTORY OF PRESENT ILLNESS: Jackie Powell is a 53 y.o. (DOB: Feb 23, 1961)  white  female whose primary care physician is Milana Obey, MD and comes to the [facility] ER today for abdominal pain. She is accompanied with her husband.  She developed vague abdominal pain on Friday night, 7/10.  She had nausea, vomiting on Saturay, 7/11.  She went to an Urgent Care center.  They gave her some meds (phenergan), but these did not help.  She was still nauseated, vomiting, and developed shaking and came to the [facility] today. She's seen Dr. Juanda Chance for an esophageal stricture in the past.  No other GI history.  Though her husband said that she has had some vague dyspepsia over the last several months.  Abdominal CT - 06/19/2014 - The study is positive for acute appendicitis without abscess. Gallstones without evidence of cholecystitis. WBC - 06/19/2014 - 20,300   Past Medical History  Diagnosis Date  . Allergy   . Anemia   . Oral candidiasis   . GERD (gastroesophageal reflux disease)   . B12 deficiency       Past Surgical History    Procedure Laterality Date  . Upper gastrointestinal endoscopy      esophageal web      Current Facility-Administered Medications  Medication Dose Route Frequency Provider Last Rate Last Dose  . 0.9 %  sodium chloride infusion  500 mL Intravenous Continuous Hart Carwin, MD      . 0.9 %  sodium chloride infusion  1,000 mL Intravenous Continuous Linwood Dibbles, MD   1,000 mL at 06/19/14 1602  . HYDROmorphone (DILAUDID) injection 0.5 mg  0.5 mg Intravenous Q30 min PRN Linwood Dibbles, MD   0.5 mg at 06/19/14 1602   Current Outpatient Prescriptions  Medication Sig Dispense Refill  . Aspirin-Acetaminophen-Caffeine (GOODY HEADACHE PO) Take 1 packet by mouth as needed. headaches       . hyoscyamine (ANASPAZ) 0.125 MG TBDP disintergrating tablet Place 0.125 mg under the tongue every 4 (four) hours as needed for cramping.      Marland Kitchen ibuprofen (ADVIL,MOTRIN) 200 MG tablet Take 400 mg by mouth every 6 (six) hours as needed.        Marland Kitchen oxymetazoline (AFRIN) 0.05 % nasal spray Place 1-2 sprays into both nostrils 2 (two) times daily as needed for congestion.      . promethazine (PHENERGAN) 25 MG tablet Take 25 mg by mouth every 4 (four) hours as needed for nausea or vomiting.      Marland Kitchen omeprazole (PRILOSEC) 20 MG capsule Take 1 capsule (20 mg total) by mouth daily.  30 capsule  1      Allergies  Allergen Reactions  . Azithromycin     REACTION: throat swells  . Codeine Nausea And Vomiting    "makes her sick as a dog"   REVIEW OF SYSTEMS: Skin:  No history of rash.  No history of abnormal moles. Infection:  No history of hepatitis or HIV.  No history of MRSA. Neurologic:  No history of stroke.  No history of seizure.  No history of headaches. Cardiac:  Has a flutter feeling in her chest at times.  No history of seeing a cardiologist or cards eval. Pulmonary:  Does not smoke cigarettes.  No asthma or bronchitis.  No OSA/CPAP. Breasts:  Has not had recent mammogram.  I stressed that she ought to get one every 2  years (at minimum).  Endocrine:  No diabetes. No thyroid disease. Gastrointestinal:  History of prior upper endoscopy by Dr. Diamond Nickel. Brodie with esophagus stretched.  No history of liver disease.  No history of gall bladder disease.  No history of pancreas disease.  Has never had a colonoscopy. Urologic:  No history of kidney stones.  No history of bladder infections. Musculoskeletal:  No history of joint or back disease. Hematologic:  No bleeding disorder.  No history of anemia.  Not anticoagulated. Psycho-social:  The patient is oriented.   The patient has no obvious psychologic or social impairment to understanding our conversation and plan.  SOCIAL and FAMILY HISTORY: Married. Husband with patient. 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 The 52 yo is a daughter and has special needs.  PHYSICAL EXAM: BP 128/54  Pulse 83  Temp(Src) 98.1 F (36.7 C) (Oral)  Resp 17  SpO2 100%  General: WN WF who is alert and generally healthy appearing.  HEENT: Normal. Pupils equal. Neck: Supple. No mass.  No thyroid mass. Lymph Nodes:  No supraclavicular or cervical nodes. Lungs: Clear to auscultation and symmetric breath sounds. Heart:  RRR. No murmur or rub. Abdomen: Soft. No mass.  No hernia. Decreased bowel sounds.  No abdominal scars.  Lower abdominal tenderness with mild guarding, not lateralized. Rectal: Not done. Extremities:  Good strength and ROM  in upper and lower extremities. Neurologic:  Grossly intact to motor and sensory function. Psychiatric: Has normal mood and affect. Behavior is normal.   DATA REVIEWED: Epic notes and labs.  Ovidio Kinavid Rector Devonshire, MD,  [facility] [address]  [address]   [place], [place]    [id number] Phone:  [phone] FAX:  [phone]

## 2014-06-19 NOTE — ED Provider Notes (Signed)
Is CSN: [id number]     Arrival date & time 06/19/14  1444 History   First MD Initiated Contact with Patient 06/19/14 1512     Chief Complaint  Patient presents with  . Nausea  . Emesis  . Abdominal Pain     Patient is a 53 y.o. female presenting with vomiting and abdominal pain.  Emesis Associated symptoms: abdominal pain   Associated symptoms: no chills and no diarrhea   Abdominal Pain Pain location:  Generalized Pain quality: cramping and pressure   Pain quality comment:  She felt very bloated like she had a lot of gas Pain radiates to:  Does not radiate Duration:  2 days Progression:  Waxing and waning Relieved by:  Nothing Ineffective treatments: anaspaz and phenergan. Associated symptoms: anorexia, fever, nausea and vomiting   Associated symptoms: no chills, no constipation, no diarrhea, no dysuria, no vaginal bleeding and no vaginal discharge   Vomiting:    Quality:  Stomach contents   Number of occurrences:  5-7 times the other day, just once today   Severity:  Severe   Past Medical History  Diagnosis Date  . Allergy   . Anemia   . Oral candidiasis   . GERD (gastroesophageal reflux disease)   . B12 deficiency    Past Surgical History  Procedure Laterality Date  . Upper gastrointestinal endoscopy      esophageal web   Family History  Problem Relation Age of Onset  . Colon cancer Neg Hx   . Heart disease Maternal Grandmother    History  Substance Use Topics  . Smoking status: Never Smoker   . Smokeless tobacco: Never Used  . Alcohol Use: No   OB History   Grav Para Term Preterm Abortions TAB SAB Ect Mult Living                 Review of Systems  Constitutional: Positive for fever. Negative for chills.  Gastrointestinal: Positive for nausea, vomiting, abdominal pain and anorexia. Negative for diarrhea and constipation.  Genitourinary: Negative for dysuria, vaginal bleeding and vaginal discharge.  All other systems reviewed and are  negative.     Allergies  Azithromycin and Codeine  Home Medications   Prior to Admission medications   Medication Sig Start Date End Date Taking? Authorizing Provider  Aspirin-Acetaminophen-Caffeine (GOODY HEADACHE PO) Take 1 packet by mouth as needed. headaches    Yes Historical Provider, MD  hyoscyamine (ANASPAZ) 0.125 MG TBDP disintergrating tablet Place 0.125 mg under the tongue every 4 (four) hours as needed for cramping.   Yes Historical Provider, MD  ibuprofen (ADVIL,MOTRIN) 200 MG tablet Take 400 mg by mouth every 6 (six) hours as needed.     Yes Historical Provider, MD  oxymetazoline (AFRIN) 0.05 % nasal spray Place 1-2 sprays into both nostrils 2 (two) times daily as needed for congestion.   Yes Historical Provider, MD  promethazine (PHENERGAN) 25 MG tablet Take 25 mg by mouth every 4 (four) hours as needed for nausea or vomiting.   Yes Historical Provider, MD  omeprazole (PRILOSEC) 20 MG capsule Take 1 capsule (20 mg total) by mouth daily. 06/17/11 06/16/12  Hart Carwin, MD   BP 128/54  Pulse 83  Temp(Src) 98.1 F (36.7 C) (Oral)  Resp 17  SpO2 100% Physical Exam  Nursing note and vitals reviewed. Constitutional: She appears well-developed and well-nourished. No distress.  HENT:  Head: Normocephalic and atraumatic.  Right Ear: External ear normal.  Left Ear: External ear normal.  Eyes: Conjunctivae are normal. Right eye exhibits no discharge. Left eye exhibits no discharge. No scleral icterus.  Neck: Neck supple. No tracheal deviation present.  Cardiovascular: Normal rate, regular rhythm and intact distal pulses.   Pulmonary/Chest: Effort normal and breath sounds normal. No stridor. No respiratory distress. She has no wheezes. She has no rales.  Abdominal: Soft. Bowel sounds are normal. She exhibits no distension. There is tenderness in the left upper quadrant and left lower quadrant. There is guarding. There is no rebound. No hernia.  Musculoskeletal: She exhibits no  edema and no tenderness.  Neurological: She is alert. She has normal strength. No cranial nerve deficit (no facial droop, extraocular movements intact, no slurred speech) or sensory deficit. She exhibits normal muscle tone. She displays no seizure activity. Coordination normal.  Skin: Skin is warm and dry. No rash noted.  Psychiatric: She has a normal mood and affect.    ED Course  Procedures (including critical care time) Labs Review Labs Reviewed  URINALYSIS, ROUTINE W REFLEX MICROSCOPIC - Abnormal; Notable for the following:    Color, Urine AMBER (*)    APPearance CLOUDY (*)    Hgb urine dipstick MODERATE (*)    Bilirubin Urine SMALL (*)    Ketones, ur 40 (*)    Leukocytes, UA MODERATE (*)    All other components within normal limits  CBC WITH DIFFERENTIAL - Abnormal; Notable for the following:    WBC 20.3 (*)    Neutrophils Relative % 92 (*)    Neutro Abs 18.7 (*)    Lymphocytes Relative 3 (*)    Lymphs Abs 0.6 (*)    Monocytes Absolute 1.1 (*)    All other components within normal limits  COMPREHENSIVE METABOLIC PANEL - Abnormal; Notable for the following:    Potassium 3.2 (*)    Glucose, Bld 106 (*)    Albumin 3.4 (*)    Total Bilirubin 1.3 (*)    All other components within normal limits  URINE MICROSCOPIC-ADD ON - Abnormal; Notable for the following:    Squamous Epithelial / LPF FEW (*)    Bacteria, UA FEW (*)    All other components within normal limits  LIPASE, BLOOD  POC URINE PREG, ED  POC URINE PREG, ED    Imaging Review Ct Abdomen Pelvis W Contrast  06/19/2014   CLINICAL DATA:  Nausea, vomiting and abdominal pain.  EXAM: CT ABDOMEN AND PELVIS WITH CONTRAST  TECHNIQUE: Multidetector CT imaging of the abdomen and pelvis was performed using the standard protocol following bolus administration of intravenous contrast.  CONTRAST:  50 mL OMNIPAQUE IOHEXOL 300 MG/ML SOLN, 100 mL OMNIPAQUE IOHEXOL 300 MG/ML SOLN  COMPARISON:  None.  FINDINGS: The lung bases are clear.   No pleural or pericardial effusion.  Small stones are seen in the gallbladder without CT evidence of cholecystitis. The liver, spleen, adrenal glands, pancreas and kidneys are unremarkable.  The appendix is dilated with extensive periappendiceal inflammatory change consistent with acute appendicitis. Appendicolith is noted. The stomach and small and large bowel appear normal. There is no lymphadenopathy. No lytic or sclerotic bony lesion is seen.  IMPRESSION: The study is positive for acute appendicitis without abscess.  Gallstones without evidence of cholecystitis.  Findings called to Dr. Lynelle Doctor at the time of interpretation.   Electronically Signed   By: Drusilla Kanner M.D.   On: 06/19/2014 21:04   Medications  0.9 %  sodium chloride infusion (0 mLs Intravenous Stopped 06/19/14 1701)    Followed by  0.9 %  sodium chloride infusion (0 mLs Intravenous Stopped 06/19/14 1830)  HYDROmorphone (DILAUDID) injection 0.5 mg (0.5 mg Intravenous Given 06/19/14 1602)  ondansetron (ZOFRAN) injection 4 mg (4 mg Intravenous Given 06/19/14 1601)  iohexol (OMNIPAQUE) 300 MG/ML solution 50 mL (50 mLs Oral Contrast Given 06/19/14 2030)  iohexol (OMNIPAQUE) 300 MG/ML solution 100 mL (100 mLs Intravenous Contrast Given 06/19/14 2030)     MDM   Final diagnoses:  Acute appendicitis, unspecified acute appendicitis type    Ct scan shows acute appendicitis.  Discussed the findings with the patient.   Consulted with Dr Ezzard StandingNewman, general surgery.  Remained stable during her ED stay.   Linwood DibblesJon Kadijah Shamoon, MD 06/19/14 2223

## 2014-06-20 ENCOUNTER — Encounter (HOSPITAL_COMMUNITY)
Admission: EM | Disposition: A | Discharge status: Home / Self Care | Payer: Self-pay | Source: Home / Self Care | Attending: Emergency Medicine

## 2014-06-20 ENCOUNTER — Encounter (HOSPITAL_COMMUNITY): Payer: Self-pay | Admitting: *Deleted

## 2014-06-20 DIAGNOSIS — K37 Unspecified appendicitis: Secondary | ICD-10-CM | POA: Diagnosis present

## 2014-06-20 DIAGNOSIS — K3580 Unspecified acute appendicitis: Secondary | ICD-10-CM

## 2014-06-20 SURGERY — APPENDECTOMY, LAPAROSCOPIC
Anesthesia: General

## 2014-06-20 MED ORDER — MORPHINE SULFATE 2 MG/ML IJ SOLN
1.0000 mg | INTRAMUSCULAR | Status: DC | PRN
  Administered 2014-06-20 (×2): 2 mg via INTRAVENOUS
  Filled 2014-06-20 (×2): qty 1

## 2014-06-20 MED ORDER — MORPHINE SULFATE 2 MG/ML IJ SOLN
INTRAMUSCULAR | Status: AC
  Filled 2014-06-20: qty 1

## 2014-06-20 MED ORDER — KCL IN DEXTROSE-NACL 20-5-0.45 MEQ/L-%-% IV SOLN
INTRAVENOUS | Status: AC
  Filled 2014-06-20: qty 1000

## 2014-06-20 MED ORDER — PIPERACILLIN-TAZOBACTAM 3.375 G IVPB
3.3750 g | Freq: Three times a day (TID) | INTRAVENOUS | Status: DC
  Administered 2014-06-20: 3.375 g via INTRAVENOUS
  Filled 2014-06-20 (×2): qty 50

## 2014-06-20 MED ORDER — PROMETHAZINE HCL 25 MG/ML IJ SOLN: 6.2500 mg | INTRAMUSCULAR | Status: DC | PRN

## 2014-06-20 MED ORDER — IBUPROFEN 600 MG PO TABS
600.0000 mg | ORAL_TABLET | Freq: Four times a day (QID) | ORAL | Status: DC | PRN
  Filled 2014-06-20: qty 1

## 2014-06-20 MED ORDER — HYDROCODONE-ACETAMINOPHEN 5-325 MG PO TABS
1.0000 | ORAL_TABLET | ORAL | Status: DC | PRN
  Administered 2014-06-20: 2 via ORAL
  Filled 2014-06-20: qty 2

## 2014-06-20 MED ORDER — GLYCOPYRROLATE 0.2 MG/ML IJ SOLN
INTRAMUSCULAR | Status: AC
  Filled 2014-06-20: qty 3

## 2014-06-20 MED ORDER — ONDANSETRON HCL 4 MG/2ML IJ SOLN: 4.0000 mg | Freq: Four times a day (QID) | INTRAMUSCULAR | Status: DC | PRN

## 2014-06-20 MED ORDER — HYDROMORPHONE HCL PF 1 MG/ML IJ SOLN: 0.2500 mg | INTRAMUSCULAR | Status: DC | PRN

## 2014-06-20 MED ORDER — NEOSTIGMINE METHYLSULFATE 10 MG/10ML IV SOLN
INTRAVENOUS | Status: DC | PRN
  Administered 2014-06-20: 4 mg via INTRAVENOUS

## 2014-06-20 MED ORDER — KCL IN DEXTROSE-NACL 20-5-0.45 MEQ/L-%-% IV SOLN
INTRAVENOUS | Status: DC
  Administered 2014-06-20: 02:00:00 via INTRAVENOUS
  Filled 2014-06-20 (×2): qty 1000

## 2014-06-20 MED ORDER — HYDROCODONE-ACETAMINOPHEN 5-325 MG PO TABS: 1.0000 | ORAL_TABLET | Freq: Four times a day (QID) | ORAL | Status: DC | PRN

## 2014-06-20 MED ORDER — AMOXICILLIN-POT CLAVULANATE 875-125 MG PO TABS: 1.0000 | ORAL_TABLET | Freq: Two times a day (BID) | ORAL | Status: DC

## 2014-06-20 MED ORDER — LACTATED RINGERS IV SOLN: INTRAVENOUS | Status: DC

## 2014-06-20 MED ORDER — GLYCOPYRROLATE 0.2 MG/ML IJ SOLN
INTRAMUSCULAR | Status: DC | PRN
  Administered 2014-06-20: 0.6 mg via INTRAVENOUS

## 2014-06-20 MED ORDER — HEPARIN SODIUM (PORCINE) 5000 UNIT/ML IJ SOLN
5000.0000 [IU] | Freq: Three times a day (TID) | INTRAMUSCULAR | Status: DC
  Administered 2014-06-20: 5000 [IU] via SUBCUTANEOUS
  Filled 2014-06-20 (×4): qty 1

## 2014-06-20 MED ORDER — IBUPROFEN 600 MG PO TABS: 600.0000 mg | ORAL_TABLET | Freq: Three times a day (TID) | ORAL | Status: DC | PRN

## 2014-06-20 MED ORDER — ONDANSETRON HCL 4 MG PO TABS: 4.0000 mg | ORAL_TABLET | Freq: Four times a day (QID) | ORAL | Status: DC | PRN

## 2014-06-20 NOTE — Discharge Instructions (Signed)
Your appointment is at 3:00pm, please arrive at least 30 min before your appointment to complete your check in paperwork.  If you are unable to arrive 30 min prior to your appointment time we may have to cancel or reschedule you. ° °LAPAROSCOPIC SURGERY: POST OP INSTRUCTIONS  °1. DIET: Follow a light bland diet the first 24 hours after arrival home, such as soup, liquids, crackers, etc. Be sure to include lots of fluids daily. Avoid fast food or heavy meals as your are more likely to get nauseated. Eat a low fat the next few days after surgery.  °2. Take your usually prescribed home medications unless otherwise directed. °3. PAIN CONTROL:  °a. Pain is best controlled by a usual combination of three different methods TOGETHER:  °i. Ice/Heat °ii. Over the counter pain medication °iii. Prescription pain medication °b. Most patients will experience some swelling and bruising around the incisions. Ice packs or heating pads (30-60 minutes up to 6 times a day) will help. Use ice for the first few days to help decrease swelling and bruising, then switch to heat to help relax tight/sore spots and speed recovery. Some people prefer to use ice alone, heat alone, alternating between ice & heat. Experiment to what works for you. Swelling and bruising can take several weeks to resolve.  °c. It is helpful to take an over-the-counter pain medication regularly for the first few weeks. Choose one of the following that works best for you:  °i. Naproxen (Aleve, etc) Two 220mg tabs twice a day °ii. Ibuprofen (Advil, etc) Three 200mg tabs four times a day (every meal & bedtime) °iii. Acetaminophen (Tylenol, etc) 500-650mg four times a day (every meal & bedtime) °d. A prescription for pain medication (such as oxycodone, hydrocodone, etc) should be given to you upon discharge. Take your pain medication as prescribed.  °i. If you are having problems/concerns with the prescription medicine (does not control pain, nausea, vomiting, rash,  itching, etc), please call us [phone] to see if we need to switch you to a different pain medicine that will work better for you and/or control your side effect better. °ii. If you need a refill on your pain medication, please contact your pharmacy. They will contact our office to request authorization. Prescriptions will not be filled after 5 pm or on week-ends. °4. Avoid getting constipated. Between the surgery and the pain medications, it is common to experience some constipation. Increasing fluid intake and taking a fiber supplement (such as Metamucil, Citrucel, FiberCon, MiraLax, etc) 1-2 times a day regularly will usually help prevent this problem from occurring. A mild laxative (prune juice, Milk of Magnesia, MiraLax, etc) should be taken according to package directions if there are no bowel movements after 48 hours.  °5. Watch out for diarrhea. If you have many loose bowel movements, simplify your diet to bland foods & liquids for a few days. Stop any stool softeners and decrease your fiber supplement. Switching to mild anti-diarrheal medications (Kayopectate, Pepto Bismol) can help. If this worsens or does not improve, please call us. °6. Wash / shower every day. You may shower over the dressings as they are waterproof. Continue to shower over incision(s) after the dressing is off. °7. Remove your waterproof bandages 5 days after surgery. You may leave the incision open to air. You may replace a dressing/Band-Aid to cover the incision for comfort if you wish.  °8. ACTIVITIES as tolerated:  °a. You may resume regular (light) daily activities beginning the next day--such as   daily self-care, walking, climbing stairs--gradually increasing activities as tolerated. If you can walk 30 minutes without difficulty, it is safe to try more intense activity such as jogging, treadmill, bicycling, low-impact aerobics, swimming, etc. °b. Save the most intensive and strenuous activity for last such as sit-ups, heavy  lifting, contact sports, etc Refrain from any heavy lifting or straining until you are off narcotics for pain control.  °c. DO NOT PUSH THROUGH PAIN. Let pain be your guide: If it hurts to do something, don't do it. Pain is your body warning you to avoid that activity for another week until the pain goes down. °d. You may drive when you are no longer taking prescription pain medication, you can comfortably wear a seatbelt, and you can safely maneuver your car and apply brakes. °e. You may have sexual intercourse when it is comfortable.  °9. FOLLOW UP in our office  °a. Please call [facility] at [phone] to set up an appointment to see your surgeon in the office for a follow-up appointment approximately 2-3 weeks after your surgery. °b. Make sure that you call for this appointment the day you arrive home to insure a convenient appointment time. °     10. IF YOU HAVE DISABILITY OR FAMILY LEAVE FORMS, BRING THEM TO THE               OFFICE FOR PROCESSING.  ° °WHEN TO CALL US [phone]:  °1. Poor pain control °2. Reactions / problems with new medications (rash/itching, nausea, etc)  °3. Fever over 101.5 F (38.5 C) °4. Inability to urinate °5. Nausea and/or vomiting °6. Worsening swelling or bruising °7. Continued bleeding from incision. °8. Increased pain, redness, or drainage from the incision ° °The clinic staff is available to answer your questions during regular business hours (8:30am-5pm). Please don’t hesitate to call and ask to speak to one of our nurses for clinical concerns.  °If you have a medical emergency, go to the nearest emergency room or call 911.  °A surgeon from [facility] is always on call at the hospitals  ° °[facility]  °[address], [address], [place], [place] [address] ?  °MAIN: [phone] ? TOLL FREE: [phone] ?  °FAX [phone]  °[email or web address] ° °

## 2014-06-20 NOTE — Discharge Summary (Signed)
[facility] Discharge Summary   Patient ID: Jackie Powell MRN: [id number] DOB/AGE: 52/08/1961 52 y.o.  Admit date: 06/19/2014 Discharge date: 06/20/2014  Admitting Diagnosis: Acute appendicitis Leukocytosis Nausea/vomiting Abdominal pain  Discharge Diagnosis Patient Active Problem List   Diagnosis Date Noted  . Appendicitis 06/20/2014  . CANDIDIASIS, ORAL 04/26/2010  . ANEMIA, IRON DEFICIENCY 04/26/2010  . ALLERGIC RHINITIS, SEASONAL 04/26/2010  . GERD 04/26/2010  . DYSPHAGIA 04/26/2010    Consultants None  Imaging: Ct Abdomen Pelvis W Contrast  06/19/2014   CLINICAL DATA:  Nausea, vomiting and abdominal pain.  EXAM: CT ABDOMEN AND PELVIS WITH CONTRAST  TECHNIQUE: Multidetector CT imaging of the abdomen and pelvis was performed using the standard protocol following bolus administration of intravenous contrast.  CONTRAST:  50 mL OMNIPAQUE IOHEXOL 300 MG/ML SOLN, 100 mL OMNIPAQUE IOHEXOL 300 MG/ML SOLN  COMPARISON:  None.  FINDINGS: The lung bases are clear.  No pleural or pericardial effusion.  Small stones are seen in the gallbladder without CT evidence of cholecystitis. The liver, spleen, adrenal glands, pancreas and kidneys are unremarkable.  The appendix is dilated with extensive periappendiceal inflammatory change consistent with acute appendicitis. Appendicolith is noted. The stomach and small and large bowel appear normal. There is no lymphadenopathy. No lytic or sclerotic bony lesion is seen.  IMPRESSION: The study is positive for acute appendicitis without abscess.  Gallstones without evidence of cholecystitis.  Findings called to Dr. Lynelle DoctorKnapp at the time of interpretation.   Electronically Signed   By: Drusilla Kannerhomas  Dalessio M.D.   On: 06/19/2014 21:04    Procedures Dr. Ezzard StandingNewman (06/19/14) - Laparoscopic Appendectomy  Hospital Course:  53 y/o white female [facility] for abdominal pain. She developed vague abdominal pain on Friday night,  7/10. She had nausea, vomiting on Saturay, 7/11. She went to an Urgent Care center. They gave her some meds (phenergan), but these did not help. She was still nauseated, vomiting, and developed shaking and [facility]  She's seen Dr. Juanda ChanceBrodie for an esophageal stricture in the past. No other GI history. Though her husband said that she has had some vague dyspepsia over the last several months.  Workup showed acute appendicitis with leukocytosis.  Patient was admitted and underwent procedure listed above.  Tolerated procedure well and was transferred to the floor.  Diet was advanced as tolerated.  On POD #1, the patient was voiding well, tolerating diet, ambulating well, pain well controlled, vital signs stable, incisions c/d/i and felt stable for discharge home.  Patient will follow up in our office in 3 weeks and knows to call with questions or concerns.  Physical Exam: General:  Alert, NAD, pleasant, comfortable Abd:  Soft, ND, mild tenderness, incisions C/D/I    Medication List         amoxicillin-clavulanate 875-125 MG per tablet  Commonly known as:  AUGMENTIN  Take 1 tablet by mouth 2 (two) times daily.     GOODY HEADACHE PO  Take 1 packet by mouth as needed. headaches     HYDROcodone-acetaminophen 5-325 MG per tablet  Commonly known as:  NORCO/VICODIN  Take 1-2 tablets by mouth every 6 (six) hours as needed for moderate pain.     hyoscyamine 0.125 MG Tbdp disintergrating tablet  Commonly known as:  ANASPAZ  Place 0.125 mg under the tongue every 4 (four) hours as needed for cramping.     ibuprofen 600 MG tablet  Commonly known as:  ADVIL,MOTRIN  Take  1 tablet (600 mg total) by mouth every 8 (eight) hours as needed (mild pain).     omeprazole 20 MG capsule  Commonly known as:  PRILOSEC  Take 1 capsule (20 mg total) by mouth daily.     oxymetazoline 0.05 % nasal spray  Commonly known as:  AFRIN  Place 1-2 sprays into both nostrils 2 (two) times daily as  needed for congestion.     promethazine 25 MG tablet  Commonly known as:  PHENERGAN  Take 25 mg by mouth every 4 (four) hours as needed for nausea or vomiting.         Follow-up Information   Follow up with [facility] [place] On 07/12/2014. (For post-operation check. Your appointment is at 3:00pm, please arrive at least 30 min before your appointment to complete your check in paperwork.  If you are unable to arrive 30 min prior to your appointment time we may have to cancel or reschedule you)    Contact information:   [address]   [place] [address] [phone]       Signed: Candiss Norse [facility] [phone]  06/20/2014, 8:47 AM

## 2014-06-20 NOTE — Transfer of Care (Signed)
Immediate Anesthesia Transfer of Care Note  Patient: Jackie Powell  Procedure(s) Performed: Procedure(s): APPENDECTOMY LAPAROSCOPIC (N/A)  Patient Location: PACU  Anesthesia Type:General  Level of Consciousness: sedated  Airway & Oxygen Therapy: Patient Spontanous Breathing  Post-op Assessment: Report given to PACU RN and Post -op Vital signs reviewed and stable  Post vital signs: Reviewed and stable  Complications: No apparent anesthesia complications

## 2014-06-22 NOTE — Anesthesia Postprocedure Evaluation (Signed)
Anesthesia Post Note  Patient: Jackie Powell  Procedure(s) Performed: Procedure(s) (LRB): APPENDECTOMY LAPAROSCOPIC (N/A)  Anesthesia type: General  Patient location: PACU  Post pain: Pain level controlled  Post assessment: Post-op Vital signs reviewed  Last Vitals:  Filed Vitals:   06/20/14 1000  BP: 114/69  Pulse: 77  Temp: 37.2 C  Resp: 18    Post vital signs: Reviewed  Level of consciousness: sedated  Complications: No apparent anesthesia complications

## 2014-06-23 ENCOUNTER — Telehealth (INDEPENDENT_AMBULATORY_CARE_PROVIDER_SITE_OTHER): Payer: Self-pay

## 2014-06-23 NOTE — Telephone Encounter (Signed)
Pt s/p lap appy on 06/19/14. Pt states that she has continued with her liquid diet, since sx. Pt states that every time she eats she has to go directly to the bathroom. Pt denies any fevers, chills, n/v at this time. Pt states that she has been up and doing a lot since she has gotten home. She has been having an increase in pain. Advised pt to scale back on her activities and then increase as tolerated. Will send Dr Ezzard StandingNewman a message regarding pts diarrhea. Pt verbalized understanding and agrees with POC.

## 2014-06-24 ENCOUNTER — Telehealth (INDEPENDENT_AMBULATORY_CARE_PROVIDER_SITE_OTHER): Payer: Self-pay

## 2014-06-24 NOTE — Telephone Encounter (Signed)
Advised patient To remain on the ABT, she may have diarrhea which is normal . She can try Probiotic OTC and yogurt to help with stool formation  . Patient verbalized understanding.

## 2014-07-12 ENCOUNTER — Encounter (INDEPENDENT_AMBULATORY_CARE_PROVIDER_SITE_OTHER): Payer: BC Managed Care – PPO

## 2014-07-19 ENCOUNTER — Telehealth (INDEPENDENT_AMBULATORY_CARE_PROVIDER_SITE_OTHER): Payer: Self-pay

## 2014-07-19 NOTE — Telephone Encounter (Signed)
V/M for patient to be rescheduled for DOW.Victorino Dike.  Jennifer States approval must be given by Tresa EndoKelly . Tresa EndoKelly will give date and time for patient

## 2014-07-21 ENCOUNTER — Encounter (INDEPENDENT_AMBULATORY_CARE_PROVIDER_SITE_OTHER): Payer: BC Managed Care – PPO

## 2015-09-15 ENCOUNTER — Encounter: Payer: Self-pay | Admitting: Internal Medicine

## 2017-03-07 ENCOUNTER — Encounter: Payer: Self-pay | Admitting: Emergency Medicine

## 2017-03-07 ENCOUNTER — Emergency Department (HOSPITAL_COMMUNITY): Payer: PRIVATE HEALTH INSURANCE

## 2017-03-07 ENCOUNTER — Emergency Department (HOSPITAL_COMMUNITY)
Admission: EM | Admit: 2017-03-07 | Discharge: 2017-03-07 | Disposition: A | Discharge status: Home / Self Care | Payer: PRIVATE HEALTH INSURANCE | Attending: Emergency Medicine | Admitting: Emergency Medicine

## 2017-03-07 DIAGNOSIS — K8020 Calculus of gallbladder without cholecystitis without obstruction: Secondary | ICD-10-CM

## 2017-03-07 DIAGNOSIS — K8050 Calculus of bile duct without cholangitis or cholecystitis without obstruction: Secondary | ICD-10-CM

## 2017-03-07 DIAGNOSIS — R1011 Right upper quadrant pain: Secondary | ICD-10-CM | POA: Diagnosis present

## 2017-03-07 DIAGNOSIS — K8051 Calculus of bile duct without cholangitis or cholecystitis with obstruction: Secondary | ICD-10-CM | POA: Diagnosis not present

## 2017-03-07 DIAGNOSIS — Z7982 Long term (current) use of aspirin: Secondary | ICD-10-CM | POA: Diagnosis not present

## 2017-03-07 LAB — URINALYSIS, ROUTINE W REFLEX MICROSCOPIC
BACTERIA UA: NONE SEEN
Bilirubin Urine: NEGATIVE
Glucose, UA: NEGATIVE mg/dL
Ketones, ur: 80 mg/dL — AB
Nitrite: NEGATIVE
Protein, ur: NEGATIVE mg/dL
SPECIFIC GRAVITY, URINE: 1.016 (ref 1.005–1.030)
pH: 5 (ref 5.0–8.0)

## 2017-03-07 LAB — COMPREHENSIVE METABOLIC PANEL
ALT: 73 U/L — AB (ref 14–54)
AST: 86 U/L — AB (ref 15–41)
Albumin: 4.2 g/dL (ref 3.5–5.0)
Alkaline Phosphatase: 54 U/L (ref 38–126)
Anion gap: 12 (ref 5–15)
CO2: 26 mmol/L (ref 22–32)
CREATININE: 0.61 mg/dL (ref 0.44–1.00)
Calcium: 9.2 mg/dL (ref 8.9–10.3)
Chloride: 103 mmol/L (ref 101–111)
GFR calc Af Amer: >60 mL/min (ref >60)
Glucose, Bld: 116 mg/dL — ABNORMAL HIGH (ref 65–99)
Potassium: 3.3 mmol/L — ABNORMAL LOW (ref 3.5–5.1)
Sodium: 141 mmol/L (ref 135–145)
Total Bilirubin: 1 mg/dL (ref 0.3–1.2)
Total Protein: 7.7 g/dL (ref 6.5–8.1)

## 2017-03-07 LAB — DIFFERENTIAL
BASOS ABS: 0 10*3/uL (ref 0.0–0.1)
Basophils Relative: 0 %
EOS PCT: 0 %
Eosinophils Absolute: 0 10*3/uL (ref 0.0–0.7)
LYMPHS ABS: 1.6 10*3/uL (ref 0.7–4.0)
LYMPHS PCT: 35 %
Monocytes Absolute: 0.4 10*3/uL (ref 0.1–1.0)
Monocytes Relative: 8 %
NEUTROS PCT: 57 %
Neutro Abs: 2.6 10*3/uL (ref 1.7–7.7)

## 2017-03-07 LAB — CBC
HEMATOCRIT: 41.6 % (ref 36.0–46.0)
Hemoglobin: 13.8 g/dL (ref 12.0–15.0)
MCH: 29.3 pg (ref 26.0–34.0)
MCHC: 33.2 g/dL (ref 30.0–36.0)
MCV: 88.3 fL (ref 78.0–100.0)
Platelets: 252 10*3/uL (ref 150–400)
RBC: 4.71 MIL/uL (ref 3.87–5.11)
RDW: 13.2 % (ref 11.5–15.5)
WBC: 4.9 10*3/uL (ref 4.0–10.5)

## 2017-03-07 LAB — LIPASE, BLOOD: Lipase: 21 U/L (ref 11–51)

## 2017-03-07 MED ORDER — ONDANSETRON HCL 4 MG/2ML IJ SOLN: 4.0000 mg | Freq: Once | INTRAMUSCULAR | Status: DC

## 2017-03-07 MED ORDER — GI COCKTAIL ~~LOC~~: 30.0000 mL | Freq: Once | ORAL | Status: DC

## 2017-03-07 MED ORDER — SODIUM CHLORIDE 0.9 % IV BOLUS (SEPSIS): 1000.0000 mL | Freq: Once | INTRAVENOUS | Status: DC

## 2017-03-07 MED ORDER — GI COCKTAIL ~~LOC~~
30.0000 mL | Freq: Once | ORAL | Status: DC
  Filled 2017-03-07: qty 30

## 2017-03-07 MED ORDER — HYDROCODONE-ACETAMINOPHEN 5-325 MG PO TABS: 1.0000 | ORAL_TABLET | Freq: Four times a day (QID) | ORAL | 0 refills | Status: DC | PRN

## 2017-03-07 MED ORDER — ONDANSETRON HCL 4 MG PO TABS: 4.0000 mg | ORAL_TABLET | Freq: Three times a day (TID) | ORAL | 0 refills | Status: DC | PRN

## 2017-03-07 NOTE — Discharge Instructions (Signed)
Please call general surgery for follow-up regarding elective surgery to get your gallbadder out Avoid fatty foods and meat in your diet. Return for worsening symptoms, including fever, intractable vomiting, worsening pain or any other symptoms concerning to you.

## 2017-03-07 NOTE — ED Notes (Signed)
Pt returned from CT °

## 2017-03-07 NOTE — ED Notes (Signed)
Patient to u/s 

## 2017-03-07 NOTE — ED Triage Notes (Addendum)
PResents with three years of off and on gall bladder trouble-one week ago she was sick with the flu and since then reports pain in RUQ, that goes into her epigastric area worse after eating, nausea and vomiting and diarrhea. Pain is described as burning and cramping and comes and goes.  Nothing makes pain better.

## 2017-03-07 NOTE — ED Provider Notes (Signed)
[facility]PT Provider Note   CSN: [id number] Arrival date & time: 03/07/17  1307     History   Chief Complaint Chief Complaint  Patient presents with  . Abdominal Pain    HPI Jackie Powell is a 56 y.o. female.  HPI 56 year old female who presents with abdominal pain. She has a history of laparoscopic appendectomy and cholelithiasis. Reports that she was told that she may need her gallbladder taken out by her general surgeon during her appendectomy. States that only randomly over the past 3 years that she had upper abdominal pain after eating. She has been having increased episodes of pain in her right upper quadrant and epigastrium over the past month after eating. She was sick with influenza for the past week, with fevers and chills cough and congestion. States that during this week after eating anything she would have severe epigastric and right upper quadrant abdominal pain with nausea and dry heaving. Has also had episodes of nonbloody diarrhea as well. Continues to have subjective fevers and chills. No dysuria or urinary frequency. Past Medical History:  Diagnosis Date  . Allergy   . Anemia   . B12 deficiency   . GERD (gastroesophageal reflux disease)   . Oral candidiasis     Patient Active Problem List   Diagnosis Date Noted  . Appendicitis 06/20/2014  . CANDIDIASIS, ORAL 04/26/2010  . ANEMIA, IRON DEFICIENCY 04/26/2010  . ALLERGIC RHINITIS, SEASONAL 04/26/2010  . GERD 04/26/2010  . DYSPHAGIA 04/26/2010    Past Surgical History:  Procedure Laterality Date  . LAPAROSCOPIC APPENDECTOMY N/A 06/19/2014   Procedure: APPENDECTOMY LAPAROSCOPIC;  Surgeon: Kandis Cocking, MD;  Location: [facility];  Service: General;  Laterality: N/A;  . UPPER GASTROINTESTINAL ENDOSCOPY     esophageal web    OB History    No data available       Home Medications    Prior to Admission medications   Medication Sig Start Date End Date Taking? Authorizing Provider    amoxicillin-clavulanate (AUGMENTIN) 875-125 MG per tablet Take 1 tablet by mouth 2 (two) times daily. 06/20/14   Nonie Hoyer, PA-C  Aspirin-Acetaminophen-Caffeine (GOODY HEADACHE PO) Take 1 packet by mouth as needed. headaches     Historical Provider, MD  HYDROcodone-acetaminophen (NORCO/VICODIN) 5-325 MG per tablet Take 1-2 tablets by mouth every 6 (six) hours as needed for moderate pain. 06/20/14   Nonie Hoyer, PA-C  HYDROcodone-acetaminophen (NORCO/VICODIN) 5-325 MG tablet Take 1 tablet by mouth every 6 (six) hours as needed for moderate pain or severe pain. 03/07/17   Lavera Guise, MD  hyoscyamine (ANASPAZ) 0.125 MG TBDP disintergrating tablet Place 0.125 mg under the tongue every 4 (four) hours as needed for cramping.    Historical Provider, MD  ibuprofen (ADVIL,MOTRIN) 600 MG tablet Take 1 tablet (600 mg total) by mouth every 8 (eight) hours as needed (mild pain). 06/20/14   Nonie Hoyer, PA-C  omeprazole (PRILOSEC) 20 MG capsule Take 1 capsule (20 mg total) by mouth daily. 06/17/11 06/16/12  Hart Carwin, MD  ondansetron (ZOFRAN) 4 MG tablet Take 1 tablet (4 mg total) by mouth every 8 (eight) hours as needed for nausea or vomiting. 03/07/17   Lavera Guise, MD  oxymetazoline (AFRIN) 0.05 % nasal spray Place 1-2 sprays into both nostrils 2 (two) times daily as needed for congestion.    Historical Provider, MD  promethazine (PHENERGAN) 25 MG tablet Take 25 mg by mouth every 4 (four) hours as needed for nausea or vomiting.  Historical Provider, MD    Family History Family History  Problem Relation Age of Onset  . Colon cancer Neg Hx   . Heart disease Maternal Grandmother     Social History Social History  Substance Use Topics  . Smoking status: Never Smoker  . Smokeless tobacco: Never Used  . Alcohol use No     Allergies   Azithromycin and Codeine   Review of Systems Review of Systems 10/14 systems reviewed and are negative other than those stated in the HPI   Physical  Exam Updated Vital Signs BP 136/69   Pulse 74   Temp 99.3 F (37.4 C) (Oral)   Resp 16   Ht  (1.626 m)   Wt 145 lb (65.8 kg)   LMP 06/17/2011   SpO2 98%   BMI 24.89 kg/m   Physical Exam Physical Exam  Nursing note and vitals reviewed. Constitutional: Well developed, well nourished, non-toxic, and in no acute distress Head: Normocephalic and atraumatic.  Mouth/Throat: Oropharynx is clear and moist.  Neck: Normal range of motion. Neck supple.  Cardiovascular: Normal rate and regular rhythm.   Pulmonary/Chest: Effort normal and breath sounds normal.  Abdominal: Soft. There is minimal epigastric and RUQ tenderness. There is no rebound and no guarding.  Musculoskeletal: Normal range of motion.  Neurological: Alert, no facial droop, fluent speech, moves all extremities symmetrically Skin: Skin is warm and dry.  Psychiatric: Cooperative   ED Treatments / Results  Labs (all labs ordered are listed, but only abnormal results are displayed) Labs Reviewed  COMPREHENSIVE METABOLIC PANEL - Abnormal; Notable for the following:       Result Value   Potassium 3.3 (*)    Glucose, Bld 116 (*)    BUN <5 (*)    AST 86 (*)    ALT 73 (*)    All other components within normal limits  URINALYSIS, ROUTINE W REFLEX MICROSCOPIC - Abnormal; Notable for the following:    Hgb urine dipstick SMALL (*)    Ketones, ur 80 (*)    Leukocytes, UA MODERATE (*)    Squamous Epithelial / LPF 0-5 (*)    All other components within normal limits  URINE CULTURE  LIPASE, BLOOD  CBC  DIFFERENTIAL    EKG  EKG Interpretation None       Radiology US Abdomen Limited Ruq  Result Date: 03/07/2017 CLINICAL DATA:  Right upper quadrant abdominal pain EXAM: US ABDOMEN LIMITED - RIGHT UPPER QUADRANT COMPARISON:  None. FINDINGS: Gallbladder: Tiny cholelithiasis. No pericholecystic fluid or wall thickening visualized. No sonographic Murphy sign noted by sonographer. Common bile duct: Diameter: 2.5 mm  Liver: No focal lesion identified. Within normal limits in parenchymal echogenicity. IMPRESSION: Cholelithiasis without sonographic evidence of acute cholecystitis. Electronically Signed   By: Elige Ko   On: 03/07/2017 18:08    Procedures Procedures (including critical care time)  Medications Ordered in ED Medications - No data to display   Initial Impression / Assessment and Plan / ED Course  I have reviewed the triage vital signs and the nursing notes.  Pertinent labs & imaging results that were available during my care of the patient were reviewed by me and considered in my medical decision making (see chart for details).     History of cholelithiasis who presents with postprandial right upper quadrant abdominal pain, that has been worsening over the past week in setting of flu like illness. She is afebrile hemodynamically stable and well-appearing. Her abdomen is soft and benign, and  currently without any significant abdominal pain. Right upper quadrant ultrasound visualized and shows cholelithiasis without acute cholecystitis. Blood work with mild transaminitis, but no evidence of biliary obstruction. Remainder of blood work reassuring. She has been able to drink ginger ale and equal crackers here without recurring pain. Suspect symptoms may be related to biliary colic and feeling worse because of influenza-like symptoms. She is given referral to general surgery. Strict return and follow-up instructions reviewed. She expressed understanding of all discharge instructions and felt comfortable with the plan of care.   Final Clinical Impressions(s) / ED Diagnoses   Final diagnoses:  RUQ abdominal pain  Calculus of gallbladder without cholecystitis without obstruction  Biliary colic    New Prescriptions Discharge Medication List as of 03/07/2017  8:35 PM    START taking these medications   Details  !! HYDROcodone-acetaminophen (NORCO/VICODIN) 5-325 MG tablet Take 1 tablet by mouth  every 6 (six) hours as needed for moderate pain or severe pain., Starting Fri 03/07/2017, Print    ondansetron (ZOFRAN) 4 MG tablet Take 1 tablet (4 mg total) by mouth every 8 (eight) hours as needed for nausea or vomiting., Starting Fri 03/07/2017, Print     !! - Potential duplicate medications found. Please discuss with provider.       Lavera Guise, MD 03/08/17 [phone]

## 2017-03-09 LAB — URINE CULTURE

## 2017-03-10 ENCOUNTER — Telehealth: Payer: Self-pay | Admitting: Emergency Medicine

## 2017-03-10 NOTE — Telephone Encounter (Signed)
Post ED Visit - Positive Culture Follow-up  Culture report reviewed by antimicrobial stewardship pharmacist:   Enzo Bi, Pharm.D.  Celedonio Miyamoto, Pharm.D., BCPS AQ-ID  Garvin Fila, Pharm.D., BCPS  Georgina Pillion, [address].D., BCPS  [place], [place].D., BCPS, AAHIVP  Estella Husk, Pharm.D., BCPS, AAHIVP  Lysle Pearl, PharmD, BCPS  Casilda Carls, PharmD, BCPS  Pollyann Samples, PharmD, BCPS  Positive urine culture Treated with none, asymptomatic, organism sensitive to the same and no further patient follow-up is required at this time.  Berle Mull 03/10/2017, 12:35 PM

## 2019-10-21 ENCOUNTER — Ambulatory Visit
Admission: EM | Admit: 2019-10-21 | Discharge: 2019-10-21 | Disposition: A | Discharge status: Home / Self Care | Payer: PRIVATE HEALTH INSURANCE | Attending: Emergency Medicine | Admitting: Emergency Medicine

## 2019-10-21 ENCOUNTER — Other Ambulatory Visit: Payer: Self-pay

## 2019-10-21 DIAGNOSIS — Z20828 Contact with and (suspected) exposure to other viral communicable diseases: Secondary | ICD-10-CM | POA: Diagnosis not present

## 2019-10-21 DIAGNOSIS — J039 Acute tonsillitis, unspecified: Secondary | ICD-10-CM

## 2019-10-21 DIAGNOSIS — Z20822 Contact with and (suspected) exposure to covid-19: Secondary | ICD-10-CM

## 2019-10-21 LAB — POCT RAPID STREP A (OFFICE): Rapid Strep A Screen: NEGATIVE

## 2019-10-21 MED ORDER — AMOXICILLIN 400 MG/5ML PO SUSR: 500.0000 mg | Freq: Two times a day (BID) | ORAL | 0 refills | Status: AC

## 2019-10-21 MED ORDER — AMOXICILLIN 500 MG PO CAPS: 500.0000 mg | ORAL_CAPSULE | Freq: Two times a day (BID) | ORAL | 0 refills | Status: DC

## 2019-10-21 MED ORDER — PREDNISONE 20 MG PO TABS: 20.0000 mg | ORAL_TABLET | Freq: Two times a day (BID) | ORAL | 0 refills | Status: AC

## 2019-10-21 NOTE — ED Provider Notes (Signed)
[facility]   [id number] 10/21/19 Arrival Time: 1510  CC: SORE THROAT  SUBJECTIVE: History from: patient.  Jackie Powell is a 58 y.o. female who presents with abrupt onset of sore throat x 3 days.  Denies sick exposure to COVID, flu or strep.  Denies recent travel.  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  Has NOT tried OTC medications.  Symptoms are made worse with swallowing, but tolerating liquids and own secretions without difficulty.  Denies previous symptoms in the past.   Denies fever, chills, fatigue, ear pain, sinus pain, rhinorrhea, nasal congestion, cough, SOB, wheezing, chest pain, nausea, rash, changes in bowel or bladder habits.    Has daughter with down-syndrome.    ROS: As per HPI.  All other pertinent ROS negative.     Past Medical History:  Diagnosis Date  . Allergy   . Anemia   . B12 deficiency   . GERD (gastroesophageal reflux disease)   . Oral candidiasis    Past Surgical History:  Procedure Laterality Date  . LAPAROSCOPIC APPENDECTOMY N/A 06/19/2014   Procedure: APPENDECTOMY LAPAROSCOPIC;  Surgeon: Kandis Cockingavid H Newman, MD;  Location: [facility];  Service: General;  Laterality: N/A;  . UPPER GASTROINTESTINAL ENDOSCOPY     esophageal web   Allergies  Allergen Reactions  . Azithromycin     REACTION: throat swells  . Codeine Nausea And Vomiting    "makes her sick as a dog"   No current facility-administered medications on file prior to encounter.    Current Outpatient Medications on File Prior to Encounter  Medication Sig Dispense Refill  . Aspirin-Acetaminophen-Caffeine (GOODY HEADACHE PO) Take 1 packet by mouth as needed. headaches     . ibuprofen (ADVIL,MOTRIN) 600 MG tablet Take 1 tablet (600 mg total) by mouth every 8 (eight) hours as needed (mild pain).  0  . ondansetron (ZOFRAN) 4 MG tablet Take 1 tablet (4 mg total) by mouth every 8 (eight) hours as needed for nausea or vomiting. 12 tablet 0  .  [DISCONTINUED] hyoscyamine (ANASPAZ) 0.125 MG TBDP disintergrating tablet Place 0.125 mg under the tongue every 4 (four) hours as needed for cramping.    . [DISCONTINUED] omeprazole (PRILOSEC) 20 MG capsule Take 1 capsule (20 mg total) by mouth daily. 30 capsule 1  . [DISCONTINUED] promethazine (PHENERGAN) 25 MG tablet Take 25 mg by mouth every 4 (four) hours as needed for nausea or vomiting.     Social History   Socioeconomic History  . Marital status: Married    Spouse name: Not on file  . Number of children: Not on file  . Years of education: Not on file  . Highest education level: Not on file  Occupational History  . Not on file  Social Needs  . Financial resource strain: Not on file  . Food insecurity    Worry: Not on file    Inability: Not on file  . Transportation needs    Medical: Not on file    Non-medical: Not on file  Tobacco Use  . Smoking status: Never Smoker  . Smokeless tobacco: Never Used  Substance and Sexual Activity  . Alcohol use: No  . Drug use: No  . Sexual activity: Not on file  Lifestyle  . Physical activity    Days per week: Not on file    Minutes per session: Not on file  . Stress: Not on file  Relationships  . Social Musicianconnections    Talks on  phone: Not on file    Gets together: Not on file    Attends religious service: Not on file    Active member of club or organization: Not on file    Attends meetings of clubs or organizations: Not on file    Relationship status: Not on file  . Intimate partner violence    Fear of current or ex partner: Not on file    Emotionally abused: Not on file    Physically abused: Not on file    Forced sexual activity: Not on file  Other Topics Concern  . Not on file  Social History Narrative  . Not on file   Family History  Problem Relation Age of Onset  . Heart disease Maternal Grandmother   . Colon cancer Neg Hx     OBJECTIVE:  Vitals:   10/21/19 1527  BP: (!) 169/84  Pulse: 76  Resp: 18  Temp:  98.6 F (37 C)  SpO2: 98%    General appearance: alert; well-appearing, nontoxic, speaking in full sentences and managing own secretions HEENT: NCAT; Ears: EACs clear, TMs pearly gray with visible cone of light, without erythema; Eyes: PERRL, EOMI grossly; Nose: no obvious rhinorrhea; Throat: oropharynx clear, tonsils 2-3+ and mildly erythematous without white tonsillar exudates, uvula midline Neck: supple without LAD Lungs: CTA bilaterally without adventitious breath sounds; cough absent Heart: regular rate and rhythm.  Radial pulses 2+ symmetrical bilaterally Skin: warm and dry Psychological: alert and cooperative; normal mood and affect  LABS: Results for orders placed or performed during the hospital encounter of 10/21/19 (from the past 24 hour(s))  POCT rapid strep A     Status: None   Collection Time: 10/21/19  3:35 PM  Result Value Ref Range   Rapid Strep A Screen Negative Negative     ASSESSMENT & PLAN:  1. Suspected COVID-19 virus infection   2. Acute tonsillitis, unspecified etiology     Meds ordered this encounter  Medications  . amoxicillin (AMOXIL) 500 MG capsule    Sig: Take 1 capsule (500 mg total) by mouth 2 (two) times daily for 10 days.    Dispense:  20 capsule    Refill:  0    Order Specific Question:   Supervising Provider    Answer:   Eustace Moore [[id number]]  . predniSONE (DELTASONE) 20 MG tablet    Sig: Take 1 tablet (20 mg total) by mouth 2 (two) times daily with a meal for 5 days.    Dispense:  10 tablet    Refill:  0    Order Specific Question:   Supervising Provider    Answer:   Eustace Moore [[id number]]    COVID testing ordered.  It will take between 5-7 days for test results.  Someone will contact you regarding abnormal results.    In the meantime: You should remain isolated in your home for 10 days from symptom onset AND greater than 72 hours after symptoms resolution (absence of fever without the use of fever-reducing medication and  improvement in respiratory symptoms), whichever is longer Get plenty of rest and push fluids Prescribed amoxicillin.  Take as directed and to completed Prescribed prednisone.  Take as directed and to completion Use OTC medications like ibuprofen or tylenol as needed fever or pain Call or go to the ED if you have any new or worsening symptoms such as fever, worsening cough, shortness of breath, chest tightness, chest pain, turning blue, changes in mental status, etc..Marland Kitchen  Reviewed expectations re: course of current medical issues. Questions answered. Outlined signs and symptoms indicating need for more acute intervention. Patient verbalized understanding. After Visit Summary given.        Lestine Box, PA-C 10/21/19 1603

## 2019-10-21 NOTE — ED Triage Notes (Signed)
Pt presents with c/o sore throat that began Monday , pt states that tonsils are swollen  But has no other symptoms

## 2019-10-21 NOTE — Discharge Instructions (Addendum)
COVID testing ordered.  It will take between 5-7 days for test results.  Someone will contact you regarding abnormal results.    In the meantime: You should remain isolated in your home for 10 days from symptom onset AND greater than 72 hours after symptoms resolution (absence of fever without the use of fever-reducing medication and improvement in respiratory symptoms), whichever is longer Get plenty of rest and push fluids Prescribed amoxicillin.  Take as directed and to completed Prescribed prednisone.  Take as directed and to completion Use OTC medications like ibuprofen or tylenol as needed fever or pain Call or go to the ED if you have any new or worsening symptoms such as fever, worsening cough, shortness of breath, chest tightness, chest pain, turning blue, changes in mental status, etc..Marland Kitchen

## 2019-10-23 ENCOUNTER — Telehealth (HOSPITAL_COMMUNITY): Payer: Self-pay | Admitting: *Deleted

## 2019-10-23 LAB — NOVEL CORONAVIRUS, NAA: SARS-CoV-2, NAA: NOT DETECTED

## 2019-10-23 NOTE — Telephone Encounter (Signed)
Returned pt's call inquiring about Covid results.  Informed pt results are still pending.  Recommended she monitors her MyChart acct for test results.  Pt verbalized understanding.

## 2019-10-24 LAB — CULTURE, GROUP A STREP (THRC)

## 2019-11-17 ENCOUNTER — Ambulatory Visit (HOSPITAL_COMMUNITY)
Admission: RE | Admit: 2019-11-17 | Discharge: 2019-11-17 | Disposition: A | Discharge status: Home / Self Care | Payer: Self-pay | Source: Ambulatory Visit | Attending: Family Medicine | Admitting: Family Medicine

## 2019-11-17 ENCOUNTER — Other Ambulatory Visit: Payer: Self-pay

## 2019-11-17 ENCOUNTER — Other Ambulatory Visit (HOSPITAL_COMMUNITY): Payer: Self-pay | Admitting: Family Medicine

## 2019-11-17 ENCOUNTER — Other Ambulatory Visit: Payer: Self-pay | Admitting: Family Medicine

## 2019-11-17 DIAGNOSIS — R2242 Localized swelling, mass and lump, left lower limb: Secondary | ICD-10-CM | POA: Insufficient documentation

## 2020-02-08 ENCOUNTER — Other Ambulatory Visit (HOSPITAL_COMMUNITY): Payer: Self-pay | Admitting: Nurse Practitioner

## 2020-02-08 DIAGNOSIS — R109 Unspecified abdominal pain: Secondary | ICD-10-CM

## 2020-02-08 DIAGNOSIS — R110 Nausea: Secondary | ICD-10-CM

## 2020-02-17 ENCOUNTER — Encounter (HOSPITAL_COMMUNITY): Payer: PRIVATE HEALTH INSURANCE

## 2020-02-21 ENCOUNTER — Ambulatory Visit (HOSPITAL_COMMUNITY)
Admission: RE | Admit: 2020-02-21 | Discharge: 2020-02-21 | Disposition: A | Discharge status: Home / Self Care | Payer: Self-pay | Source: Ambulatory Visit | Attending: Nurse Practitioner | Admitting: Nurse Practitioner

## 2020-02-21 ENCOUNTER — Other Ambulatory Visit: Payer: Self-pay

## 2020-02-21 ENCOUNTER — Encounter (HOSPITAL_COMMUNITY): Payer: Self-pay

## 2020-02-21 DIAGNOSIS — R109 Unspecified abdominal pain: Secondary | ICD-10-CM | POA: Insufficient documentation

## 2020-02-21 DIAGNOSIS — R110 Nausea: Secondary | ICD-10-CM | POA: Insufficient documentation

## 2020-02-21 MED ORDER — TECHNETIUM TC 99M MEBROFENIN IV KIT
5.0000 | PACK | Freq: Once | INTRAVENOUS | Status: AC
  Administered 2020-02-21: 5.1 via INTRAVENOUS

## 2020-03-20 ENCOUNTER — Other Ambulatory Visit: Payer: Self-pay | Admitting: Family Medicine

## 2020-03-20 ENCOUNTER — Other Ambulatory Visit (HOSPITAL_COMMUNITY): Payer: Self-pay | Admitting: Family Medicine

## 2020-03-20 DIAGNOSIS — K8080 Other cholelithiasis without obstruction: Secondary | ICD-10-CM

## 2020-03-23 ENCOUNTER — Ambulatory Visit (HOSPITAL_COMMUNITY): Payer: PRIVATE HEALTH INSURANCE

## 2020-03-28 ENCOUNTER — Other Ambulatory Visit: Payer: Self-pay

## 2020-03-28 ENCOUNTER — Ambulatory Visit (HOSPITAL_COMMUNITY)
Admission: RE | Admit: 2020-03-28 | Discharge: 2020-03-28 | Disposition: A | Discharge status: Home / Self Care | Payer: PRIVATE HEALTH INSURANCE | Source: Ambulatory Visit | Attending: Family Medicine | Admitting: Family Medicine

## 2020-03-28 ENCOUNTER — Ambulatory Visit: Payer: Self-pay | Admitting: General Surgery

## 2020-03-28 DIAGNOSIS — K8080 Other cholelithiasis without obstruction: Secondary | ICD-10-CM | POA: Insufficient documentation

## 2020-04-04 ENCOUNTER — Ambulatory Visit: Payer: Self-pay | Admitting: General Surgery

## 2020-05-09 ENCOUNTER — Other Ambulatory Visit: Payer: Self-pay

## 2020-05-09 ENCOUNTER — Ambulatory Visit (INDEPENDENT_AMBULATORY_CARE_PROVIDER_SITE_OTHER): Payer: PRIVATE HEALTH INSURANCE | Admitting: General Surgery

## 2020-05-09 ENCOUNTER — Encounter: Payer: Self-pay | Admitting: General Surgery

## 2020-05-09 VITALS — BP 160/81 | HR 69 | Temp 98.1°F | Resp 12 | Ht 64.0 in | Wt 133.0 lb

## 2020-05-09 DIAGNOSIS — K8020 Calculus of gallbladder without cholecystitis without obstruction: Secondary | ICD-10-CM | POA: Diagnosis not present

## 2020-05-09 NOTE — Patient Instructions (Signed)
Laparoscopic Cholecystectomy Laparoscopic cholecystectomy is surgery to remove the gallbladder. The gallbladder is a pear-shaped organ that lies beneath the liver on the right side of the body. The gallbladder stores bile, which is a fluid that helps the body to digest fats. Cholecystectomy is often done for inflammation of the gallbladder (cholecystitis). This condition is usually caused by a buildup of gallstones (cholelithiasis) in the gallbladder. Gallstones can block the flow of bile, which can result in inflammation and pain. In severe cases, emergency surgery may be required. This procedure is done though small incisions in your abdomen (laparoscopic surgery). A thin scope with a camera (laparoscope) is inserted through one incision. Thin surgical instruments are inserted through the other incisions. In some cases, a laparoscopic procedure may be turned into a type of surgery that is done through a larger incision (open surgery). Tell a health care provider about:  Any allergies you have.  All medicines you are taking, including vitamins, herbs, eye drops, creams, and over-the-counter medicines.  Any problems you or family members have had with anesthetic medicines.  Any blood disorders you have.  Any surgeries you have had.  Any medical conditions you have.  Whether you are pregnant or may be pregnant. What are the risks? Generally, this is a safe procedure. However, problems may occur, including:  Infection.  Bleeding.  Allergic reactions to medicines.  Damage to other structures or organs.  A stone remaining in the common bile duct. The common bile duct carries bile from the gallbladder into the small intestine.  A bile leak from the cyst duct that is clipped when your gallbladder is removed. What happens before the procedure?   Medicines  Ask your health care provider about: ? Changing or stopping your regular medicines. This is especially important if you are taking  diabetes medicines or blood thinners. ? Taking medicines such as aspirin and ibuprofen. These medicines can thin your blood. Do not take these medicines before your procedure if your health care provider instructs you not to.  You may be given antibiotic medicine to help prevent infection. General instructions  Let your health care provider know if you develop a cold or an infection before surgery.  Plan to have someone take you home from the hospital or clinic.  Ask your health care provider how your surgical site will be marked or identified. What happens during the procedure?   To reduce your risk of infection: ? Your health care team will wash or sanitize their hands. ? Your skin will be washed with soap. ? Hair may be removed from the surgical area.  An IV tube may be inserted into one of your veins.  You will be given one or more of the following: ? A medicine to help you relax (sedative). ? A medicine to make you fall asleep (general anesthetic).  A breathing tube will be placed in your mouth.  Your surgeon will make several small cuts (incisions) in your abdomen.  The laparoscope will be inserted through one of the small incisions. The camera on the laparoscope will send images to a TV screen (monitor) in the operating room. This lets your surgeon see inside your abdomen.  Air-like gas will be pumped into your abdomen. This will expand your abdomen to give the surgeon more room to perform the surgery.  Other tools that are needed for the procedure will be inserted through the other incisions. The gallbladder will be removed through one of the incisions.  Your common bile duct   may be examined. If stones are found in the common bile duct, they may be removed.  After your gallbladder has been removed, the incisions will be closed with stitches (sutures), staples, or skin glue.  Your incisions may be covered with a bandage (dressing). The procedure may vary among health  care providers and hospitals. What happens after the procedure?  Your blood pressure, heart rate, breathing rate, and blood oxygen level will be monitored until the medicines you were given have worn off.  You will be given medicines as needed to control your pain.  Do not drive for 24 hours if you were given a sedative. This information is not intended to replace advice given to you by your health care provider. Make sure you discuss any questions you have with your health care provider. Document Revised: 11/07/2017 Document Reviewed: 05/13/2016 Elsevier Patient Education  2020 [employer]  

## 2020-05-09 NOTE — H&P (Signed)
Jackie Powell; [id number]; Jun 09, 1961   HPI Patient is a 59 year old white female who was referred to my care by Dr. Kathlene November for evaluation and treatment of cholelithiasis.  Patient has had intermittent episodes of right upper quadrant and epigastric pain with bloating and reflux for approximately 3 years.  Recently, it seems to have increased in frequency and intensity.  It occurs with varied foods.  No fever, chills, jaundice have been noted.  She did have an ultrasound recently which showed cholelithiasis with a normal common bile duct.  She currently has 3 out of 10 abdominal pain.  She now has become scared to eat as she thinks the pain will recur. Past Medical History:  Diagnosis Date  . Allergy   . Anemia   . B12 deficiency   . GERD (gastroesophageal reflux disease)   . Oral candidiasis     Past Surgical History:  Procedure Laterality Date  . LAPAROSCOPIC APPENDECTOMY N/A 06/19/2014   Procedure: APPENDECTOMY LAPAROSCOPIC;  Surgeon: Kandis Cocking, MD;  Location: [facility];  Service: General;  Laterality: N/A;  . UPPER GASTROINTESTINAL ENDOSCOPY     esophageal web    Family History  Problem Relation Age of Onset  . Colon cancer Father   . Heart disease Maternal Grandmother     Current Outpatient Medications on File Prior to Visit  Medication Sig Dispense Refill  . [DISCONTINUED] hyoscyamine (ANASPAZ) 0.125 MG TBDP disintergrating tablet Place 0.125 mg under the tongue every 4 (four) hours as needed for cramping.    . [DISCONTINUED] omeprazole (PRILOSEC) 20 MG capsule Take 1 capsule (20 mg total) by mouth daily. 30 capsule 1  . [DISCONTINUED] promethazine (PHENERGAN) 25 MG tablet Take 25 mg by mouth every 4 (four) hours as needed for nausea or vomiting.     No current facility-administered medications on file prior to visit.    Allergies  Allergen Reactions  . Azithromycin     REACTION: throat swells  . Codeine Nausea And Vomiting    "makes her sick as a dog"    Social  History   Substance and Sexual Activity  Alcohol Use No    Social History   Tobacco Use  Smoking Status Never Smoker  Smokeless Tobacco Never Used    Review of Systems  Constitutional: Negative.   HENT: Positive for sinus pain.   Eyes: Negative.   Respiratory: Negative.   Cardiovascular: Negative.   Gastrointestinal: Positive for abdominal pain, constipation, heartburn and nausea.  Genitourinary: Negative.   Musculoskeletal: Negative.   Skin: Negative.   Neurological: Negative.   Endo/Heme/Allergies: Negative.   Psychiatric/Behavioral: Negative.     Objective   Vitals:   05/09/20 1247  BP: (!) 160/81  Pulse: 69  Resp: 12  Temp: 98.1 F (36.7 C)  SpO2: 98%    Physical Exam Vitals reviewed.  Constitutional:      Appearance: Normal appearance. She is normal weight. She is not ill-appearing.  HENT:     Head: Normocephalic and atraumatic.  Eyes:     General: No scleral icterus. Cardiovascular:     Rate and Rhythm: Normal rate and regular rhythm.     Heart sounds: No murmur. No friction rub. No gallop.   Pulmonary:     Effort: Pulmonary effort is normal. No respiratory distress.     Breath sounds: Normal breath sounds. No stridor. No wheezing, rhonchi or rales.  Abdominal:     General: Abdomen is flat. Bowel sounds are normal. There is no distension.  Palpations: Abdomen is soft. There is no mass.     Tenderness: There is abdominal tenderness. There is no guarding or rebound.     Hernia: No hernia is present.     Comments: Discomfort noted to palpation in the right upper quadrant and epigastric regions.  Skin:    General: Skin is warm and dry.  Neurological:     Mental Status: She is alert and oriented to person, place, and time.    Primary care notes reviewed.  Recent liver labs within normal limits. Assessment  Biliary colic, cholelithiasis Plan   Patient is scheduled for laparoscopic cholecystectomy on 05/15/2020.  The risks and benefits of the  procedure including bleeding, infection, hepatobiliary injury, and the possibility of an open procedure were fully explained to the patient, who gives informed consent. 

## 2020-05-09 NOTE — Progress Notes (Signed)
Jackie Powell; [id number]; Jun 09, 1961   HPI Patient is a 59 year old white female who was referred to my care by Dr. Kathlene November for evaluation and treatment of cholelithiasis.  Patient has had intermittent episodes of right upper quadrant and epigastric pain with bloating and reflux for approximately 3 years.  Recently, it seems to have increased in frequency and intensity.  It occurs with varied foods.  No fever, chills, jaundice have been noted.  She did have an ultrasound recently which showed cholelithiasis with a normal common bile duct.  She currently has 3 out of 10 abdominal pain.  She now has become scared to eat as she thinks the pain will recur. Past Medical History:  Diagnosis Date  . Allergy   . Anemia   . B12 deficiency   . GERD (gastroesophageal reflux disease)   . Oral candidiasis     Past Surgical History:  Procedure Laterality Date  . LAPAROSCOPIC APPENDECTOMY N/A 06/19/2014   Procedure: APPENDECTOMY LAPAROSCOPIC;  Surgeon: Kandis Cocking, MD;  Location: [facility];  Service: General;  Laterality: N/A;  . UPPER GASTROINTESTINAL ENDOSCOPY     esophageal web    Family History  Problem Relation Age of Onset  . Colon cancer Father   . Heart disease Maternal Grandmother     Current Outpatient Medications on File Prior to Visit  Medication Sig Dispense Refill  . [DISCONTINUED] hyoscyamine (ANASPAZ) 0.125 MG TBDP disintergrating tablet Place 0.125 mg under the tongue every 4 (four) hours as needed for cramping.    . [DISCONTINUED] omeprazole (PRILOSEC) 20 MG capsule Take 1 capsule (20 mg total) by mouth daily. 30 capsule 1  . [DISCONTINUED] promethazine (PHENERGAN) 25 MG tablet Take 25 mg by mouth every 4 (four) hours as needed for nausea or vomiting.     No current facility-administered medications on file prior to visit.    Allergies  Allergen Reactions  . Azithromycin     REACTION: throat swells  . Codeine Nausea And Vomiting    "makes her sick as a dog"    Social  History   Substance and Sexual Activity  Alcohol Use No    Social History   Tobacco Use  Smoking Status Never Smoker  Smokeless Tobacco Never Used    Review of Systems  Constitutional: Negative.   HENT: Positive for sinus pain.   Eyes: Negative.   Respiratory: Negative.   Cardiovascular: Negative.   Gastrointestinal: Positive for abdominal pain, constipation, heartburn and nausea.  Genitourinary: Negative.   Musculoskeletal: Negative.   Skin: Negative.   Neurological: Negative.   Endo/Heme/Allergies: Negative.   Psychiatric/Behavioral: Negative.     Objective   Vitals:   05/09/20 1247  BP: (!) 160/81  Pulse: 69  Resp: 12  Temp: 98.1 F (36.7 C)  SpO2: 98%    Physical Exam Vitals reviewed.  Constitutional:      Appearance: Normal appearance. She is normal weight. She is not ill-appearing.  HENT:     Head: Normocephalic and atraumatic.  Eyes:     General: No scleral icterus. Cardiovascular:     Rate and Rhythm: Normal rate and regular rhythm.     Heart sounds: No murmur. No friction rub. No gallop.   Pulmonary:     Effort: Pulmonary effort is normal. No respiratory distress.     Breath sounds: Normal breath sounds. No stridor. No wheezing, rhonchi or rales.  Abdominal:     General: Abdomen is flat. Bowel sounds are normal. There is no distension.  Palpations: Abdomen is soft. There is no mass.     Tenderness: There is abdominal tenderness. There is no guarding or rebound.     Hernia: No hernia is present.     Comments: Discomfort noted to palpation in the right upper quadrant and epigastric regions.  Skin:    General: Skin is warm and dry.  Neurological:     Mental Status: She is alert and oriented to person, place, and time.    Primary care notes reviewed.  Recent liver labs within normal limits. Assessment  Biliary colic, cholelithiasis Plan   Patient is scheduled for laparoscopic cholecystectomy on 05/15/2020.  The risks and benefits of the  procedure including bleeding, infection, hepatobiliary injury, and the possibility of an open procedure were fully explained to the patient, who gives informed consent.

## 2020-05-12 ENCOUNTER — Other Ambulatory Visit: Payer: Self-pay

## 2020-05-12 ENCOUNTER — Encounter (HOSPITAL_COMMUNITY)
Admission: RE | Admit: 2020-05-12 | Discharge: 2020-05-12 | Disposition: A | Discharge status: Home / Self Care | Payer: PRIVATE HEALTH INSURANCE | Source: Ambulatory Visit | Attending: General Surgery | Admitting: General Surgery

## 2020-05-12 ENCOUNTER — Encounter (HOSPITAL_COMMUNITY): Payer: Self-pay

## 2020-05-12 ENCOUNTER — Other Ambulatory Visit (HOSPITAL_COMMUNITY)
Admission: RE | Admit: 2020-05-12 | Discharge: 2020-05-12 | Disposition: A | Discharge status: Home / Self Care | Payer: PRIVATE HEALTH INSURANCE | Source: Ambulatory Visit | Attending: General Surgery | Admitting: General Surgery

## 2020-05-12 DIAGNOSIS — Z20822 Contact with and (suspected) exposure to covid-19: Secondary | ICD-10-CM | POA: Diagnosis not present

## 2020-05-12 DIAGNOSIS — Z01812 Encounter for preprocedural laboratory examination: Secondary | ICD-10-CM | POA: Diagnosis not present

## 2020-05-12 LAB — SARS CORONAVIRUS 2 (TAT 6-24 HRS): SARS Coronavirus 2: NEGATIVE

## 2020-05-12 NOTE — Patient Instructions (Signed)
Your procedure is scheduled on: 6/72021  Report to [facility] at  7:30   AM.  Call this number if you have problems the morning of surgery: [phone]   Remember:   Do not Eat or Drink after midnight         No Smoking the morning of surgery  :  Take these medicines the morning of surgery with A SIP OF WATER: none   Do not wear jewelry, make-up or nail polish.  Do not wear lotions, powders, or perfumes. You may wear deodorant.  Do not shave 48 hours prior to surgery. Men may shave face and neck.  Do not bring valuables to the hospital.  Contacts, dentures or bridgework may not be worn into surgery.  Leave suitcase in the car. After surgery it may be brought to your room.  For patients admitted to the hospital, checkout time is 11:00 AM the day of discharge.   Patients discharged the day of surgery will not be allowed to drive home.    Special Instructions: Shower using CHG night before surgery and shower the day of surgery use CHG.  Use special wash - you have one bottle of CHG for all showers.  You should use approximately 1/2 of the bottle for each shower.  Laparoscopic Cholecystectomy, Care After This sheet gives you information about how to care for yourself after your procedure. Your health care provider may also give you more specific instructions. If you have problems or questions, contact your health care provider. What can I expect after the procedure? After the procedure, it is common to have:  Pain at your incision sites. You will be given medicines to control this pain.  Mild nausea or vomiting.  Bloating and possible shoulder pain from the air-like gas that was used during the procedure. Follow these instructions at home: Incision care   Follow instructions from your health care provider about how to take care of your incisions. Make sure you: ? Wash your hands with soap and water before you change your bandage (dressing). If soap and water are not available,  use hand sanitizer. ? Change your dressing as told by your health care provider. ? Leave stitches (sutures), skin glue, or adhesive strips in place. These skin closures may need to be in place for 2 weeks or longer. If adhesive strip edges start to loosen and curl up, you may trim the loose edges. Do not remove adhesive strips completely unless your health care provider tells you to do that.  Do not take baths, swim, or use a hot tub until your health care provider approves. Ask your health care provider if you can take showers. You may only be allowed to take sponge baths for bathing.  Check your incision area every day for signs of infection. Check for: ? More redness, swelling, or pain. ? More fluid or blood. ? Warmth. ? Pus or a bad smell. Activity  Do not drive or use heavy machinery while taking prescription pain medicine.  Do not lift anything that is heavier than 10 lb (4.5 kg) until your health care provider approves.  Do not play contact sports until your health care provider approves.  Do not drive for 24 hours if you were given a medicine to help you relax (sedative).  Rest as needed. Do not return to work or school until your health care provider approves. General instructions  Take over-the-counter and prescription medicines only as told by your health care provider.  To  prevent or treat constipation while you are taking prescription pain medicine, your health care provider may recommend that you: ? Drink enough fluid to keep your urine clear or pale yellow. ? Take over-the-counter or prescription medicines. ? Eat foods that are high in fiber, such as fresh fruits and vegetables, whole grains, and beans. ? Limit foods that are high in fat and processed sugars, such as fried and sweet foods. Contact a health care provider if:  You develop a rash.  You have more redness, swelling, or pain around your incisions.  You have more fluid or blood coming from your  incisions.  Your incisions feel warm to the touch.  You have pus or a bad smell coming from your incisions.  You have a fever.  One or more of your incisions breaks open. Get help right away if:  You have trouble breathing.  You have chest pain.  You have increasing pain in your shoulders.  You faint or feel dizzy when you stand.  You have severe pain in your abdomen.  You have nausea or vomiting that lasts for more than one day.  You have leg pain. This information is not intended to replace advice given to you by your health care provider. Make sure you discuss any questions you have with your health care provider. Document Revised: 11/07/2017 Document Reviewed: 05/13/2016 Elsevier Patient Education  2020 [place] Anesthesia, Adult, Care After This sheet gives you information about how to care for yourself after your procedure. Your health care provider may also give you more specific instructions. If you have problems or questions, contact your health care provider. What can I expect after the procedure? After the procedure, the following side effects are common:  Pain or discomfort at the IV site.  Nausea.  Vomiting.  Sore throat.  Trouble concentrating.  Feeling cold or chills.  Weak or tired.  Sleepiness and fatigue.  Soreness and body aches. These side effects can affect parts of the body that were not involved in surgery. Follow these instructions at home:  For at least 24 hours after the procedure:  Have a responsible adult stay with you. It is important to have someone help care for you until you are awake and alert.  Rest as needed.  Do not: ? Participate in activities in which you could fall or become injured. ? Drive. ? Use heavy machinery. ? Drink alcohol. ? Take sleeping pills or medicines that cause drowsiness. ? Make important decisions or sign legal documents. ? Take care of children on your own. Eating and  drinking  Follow any instructions from your health care provider about eating or drinking restrictions.  When you feel hungry, start by eating small amounts of foods that are soft and easy to digest (bland), such as toast. Gradually return to your regular diet.  Drink enough fluid to keep your urine pale yellow.  If you vomit, rehydrate by drinking water, juice, or clear broth. General instructions  If you have sleep apnea, surgery and certain medicines can increase your risk for breathing problems. Follow instructions from your health care provider about wearing your sleep device: ? Anytime you are sleeping, including during daytime naps. ? While taking prescription pain medicines, sleeping medicines, or medicines that make you drowsy.  Return to your normal activities as told by your health care provider. Ask your health care provider what activities are safe for you.  Take over-the-counter and prescription medicines only as told by your health care  provider.  If you smoke, do not smoke without supervision.  Keep all follow-up visits as told by your health care provider. This is important. Contact a health care provider if:  You have nausea or vomiting that does not get better with medicine.  You cannot eat or drink without vomiting.  You have pain that does not get better with medicine.  You are unable to pass urine.  You develop a skin rash.  You have a fever.  You have redness around your IV site that gets worse. Get help right away if:  You have difficulty breathing.  You have chest pain.  You have blood in your urine or stool, or you vomit blood. Summary  After the procedure, it is common to have a sore throat or nausea. It is also common to feel tired.  Have a responsible adult stay with you for the first 24 hours after general anesthesia. It is important to have someone help care for you until you are awake and alert.  When you feel hungry, start by eating  small amounts of foods that are soft and easy to digest (bland), such as toast. Gradually return to your regular diet.  Drink enough fluid to keep your urine pale yellow.  Return to your normal activities as told by your health care provider. Ask your health care provider what activities are safe for you. This information is not intended to replace advice given to you by your health care provider. Make sure you discuss any questions you have with your health care provider. Document Revised: 11/28/2017 Document Reviewed: 07/11/2017 Elsevier Patient Education  [place].

## 2020-05-15 ENCOUNTER — Ambulatory Visit (HOSPITAL_COMMUNITY): Payer: PRIVATE HEALTH INSURANCE | Admitting: Anesthesiology

## 2020-05-15 ENCOUNTER — Encounter (HOSPITAL_COMMUNITY): Payer: Self-pay | Admitting: General Surgery

## 2020-05-15 ENCOUNTER — Encounter (HOSPITAL_COMMUNITY)
Admission: RE | Disposition: A | Discharge status: Home / Self Care | Payer: Self-pay | Source: Home / Self Care | Attending: General Surgery

## 2020-05-15 ENCOUNTER — Ambulatory Visit (HOSPITAL_COMMUNITY)
Admission: RE | Admit: 2020-05-15 | Discharge: 2020-05-15 | Disposition: A | Discharge status: Home / Self Care | Payer: PRIVATE HEALTH INSURANCE | Attending: General Surgery | Admitting: General Surgery

## 2020-05-15 DIAGNOSIS — Z885 Allergy status to narcotic agent status: Secondary | ICD-10-CM | POA: Insufficient documentation

## 2020-05-15 DIAGNOSIS — K8020 Calculus of gallbladder without cholecystitis without obstruction: Secondary | ICD-10-CM | POA: Diagnosis present

## 2020-05-15 DIAGNOSIS — K8010 Calculus of gallbladder with chronic cholecystitis without obstruction: Secondary | ICD-10-CM | POA: Diagnosis not present

## 2020-05-15 DIAGNOSIS — Z881 Allergy status to other antibiotic agents status: Secondary | ICD-10-CM | POA: Diagnosis not present

## 2020-05-15 HISTORY — PX: CHOLECYSTECTOMY: SHX55

## 2020-05-15 SURGERY — LAPAROSCOPIC CHOLECYSTECTOMY
Anesthesia: General | Site: Abdomen

## 2020-05-15 MED ORDER — PROPOFOL 10 MG/ML IV BOLUS
INTRAVENOUS | Status: AC
  Filled 2020-05-15: qty 20

## 2020-05-15 MED ORDER — LIDOCAINE 2% (20 MG/ML) 5 ML SYRINGE
INTRAMUSCULAR | Status: AC
  Filled 2020-05-15: qty 15

## 2020-05-15 MED ORDER — ONDANSETRON HCL 4 MG PO TABS: 4.0000 mg | ORAL_TABLET | Freq: Three times a day (TID) | ORAL | 0 refills | Status: DC | PRN

## 2020-05-15 MED ORDER — LACTATED RINGERS IV SOLN
Freq: Once | INTRAVENOUS | Status: AC
  Administered 2020-05-15: 1000 mL via INTRAVENOUS

## 2020-05-15 MED ORDER — PROMETHAZINE HCL 25 MG/ML IJ SOLN: 6.2500 mg | INTRAMUSCULAR | Status: DC | PRN

## 2020-05-15 MED ORDER — LIDOCAINE HCL (CARDIAC) PF 50 MG/5ML IV SOSY
PREFILLED_SYRINGE | INTRAVENOUS | Status: DC | PRN
  Administered 2020-05-15: 150 mg via INTRAVENOUS
  Administered 2020-05-15: 60 mg via INTRAVENOUS

## 2020-05-15 MED ORDER — CIPROFLOXACIN IN D5W 400 MG/200ML IV SOLN
400.0000 mg | INTRAVENOUS | Status: AC
  Administered 2020-05-15: 400 mg via INTRAVENOUS

## 2020-05-15 MED ORDER — HEMOSTATIC AGENTS (NO CHARGE) OPTIME
TOPICAL | Status: DC | PRN
  Administered 2020-05-15: 1 via TOPICAL

## 2020-05-15 MED ORDER — KETOROLAC TROMETHAMINE 30 MG/ML IJ SOLN
30.0000 mg | Freq: Once | INTRAMUSCULAR | Status: AC
  Administered 2020-05-15: 30 mg via INTRAVENOUS
  Filled 2020-05-15: qty 1

## 2020-05-15 MED ORDER — SUGAMMADEX SODIUM 200 MG/2ML IV SOLN
INTRAVENOUS | Status: DC | PRN
  Administered 2020-05-15: 240 mg via INTRAVENOUS

## 2020-05-15 MED ORDER — ONDANSETRON HCL 4 MG/2ML IJ SOLN
INTRAMUSCULAR | Status: DC | PRN
  Administered 2020-05-15: 4 mg via INTRAVENOUS

## 2020-05-15 MED ORDER — BUPIVACAINE LIPOSOME 1.3 % IJ SUSP
INTRAMUSCULAR | Status: DC | PRN
  Administered 2020-05-15: 20 mL

## 2020-05-15 MED ORDER — MIDAZOLAM HCL 2 MG/2ML IJ SOLN
INTRAMUSCULAR | Status: AC
  Filled 2020-05-15: qty 2

## 2020-05-15 MED ORDER — EPHEDRINE 5 MG/ML INJ
INTRAVENOUS | Status: AC
  Filled 2020-05-15: qty 10

## 2020-05-15 MED ORDER — SODIUM CHLORIDE 0.9 % IR SOLN
Status: DC | PRN
  Administered 2020-05-15: 1

## 2020-05-15 MED ORDER — MIDAZOLAM HCL 5 MG/5ML IJ SOLN
INTRAMUSCULAR | Status: DC | PRN
  Administered 2020-05-15: 2 mg via INTRAVENOUS

## 2020-05-15 MED ORDER — SCOPOLAMINE 1 MG/3DAYS TD PT72
1.0000 | MEDICATED_PATCH | Freq: Once | TRANSDERMAL | Status: DC
  Administered 2020-05-15: 1.5 mg via TRANSDERMAL

## 2020-05-15 MED ORDER — LACTATED RINGERS IV SOLN: INTRAVENOUS | Status: DC | PRN

## 2020-05-15 MED ORDER — SUCCINYLCHOLINE CHLORIDE 200 MG/10ML IV SOSY
PREFILLED_SYRINGE | INTRAVENOUS | Status: AC
  Filled 2020-05-15: qty 20

## 2020-05-15 MED ORDER — TRAMADOL HCL 50 MG PO TABS: 50.0000 mg | ORAL_TABLET | Freq: Four times a day (QID) | ORAL | 0 refills | Status: DC | PRN

## 2020-05-15 MED ORDER — CHLORHEXIDINE GLUCONATE 0.12 % MT SOLN
15.0000 mL | Freq: Once | OROMUCOSAL | Status: AC
  Administered 2020-05-15: 15 mL via OROMUCOSAL

## 2020-05-15 MED ORDER — CIPROFLOXACIN IN D5W 400 MG/200ML IV SOLN
INTRAVENOUS | Status: AC
  Filled 2020-05-15: qty 200

## 2020-05-15 MED ORDER — CHLORHEXIDINE GLUCONATE CLOTH 2 % EX PADS: 6.0000 | MEDICATED_PAD | Freq: Once | CUTANEOUS | Status: DC

## 2020-05-15 MED ORDER — ROCURONIUM 10MG/ML (10ML) SYRINGE FOR MEDFUSION PUMP - OPTIME
INTRAVENOUS | Status: DC | PRN
  Administered 2020-05-15: 30 mg via INTRAVENOUS

## 2020-05-15 MED ORDER — CHLORHEXIDINE GLUCONATE 0.12 % MT SOLN
OROMUCOSAL | Status: AC
  Filled 2020-05-15: qty 15

## 2020-05-15 MED ORDER — SCOPOLAMINE 1 MG/3DAYS TD PT72
MEDICATED_PATCH | TRANSDERMAL | Status: AC
  Filled 2020-05-15: qty 1

## 2020-05-15 MED ORDER — ROCURONIUM BROMIDE 10 MG/ML (PF) SYRINGE
PREFILLED_SYRINGE | INTRAVENOUS | Status: AC
  Filled 2020-05-15: qty 20

## 2020-05-15 MED ORDER — FENTANYL CITRATE (PF) 100 MCG/2ML IJ SOLN
INTRAMUSCULAR | Status: DC | PRN
  Administered 2020-05-15: 75 ug via INTRAVENOUS

## 2020-05-15 MED ORDER — HYDROMORPHONE HCL 1 MG/ML IJ SOLN
0.2500 mg | INTRAMUSCULAR | Status: DC | PRN
  Administered 2020-05-15 (×2): 0.5 mg via INTRAVENOUS
  Filled 2020-05-15: qty 0.5

## 2020-05-15 MED ORDER — ORAL CARE MOUTH RINSE: 15.0000 mL | Freq: Once | OROMUCOSAL | Status: AC

## 2020-05-15 MED ORDER — FENTANYL CITRATE (PF) 250 MCG/5ML IJ SOLN
INTRAMUSCULAR | Status: AC
  Filled 2020-05-15: qty 5

## 2020-05-15 MED ORDER — ONDANSETRON HCL 4 MG/2ML IJ SOLN
INTRAMUSCULAR | Status: AC
  Filled 2020-05-15: qty 4

## 2020-05-15 MED ORDER — BUPIVACAINE LIPOSOME 1.3 % IJ SUSP
INTRAMUSCULAR | Status: AC
  Filled 2020-05-15: qty 20

## 2020-05-15 MED ORDER — HYDROMORPHONE HCL 1 MG/ML IJ SOLN
INTRAMUSCULAR | Status: AC
  Filled 2020-05-15: qty 0.5

## 2020-05-15 MED ORDER — MEPERIDINE HCL 50 MG/ML IJ SOLN: 6.2500 mg | INTRAMUSCULAR | Status: DC | PRN

## 2020-05-15 SURGICAL SUPPLY — 43 items
APPLIER CLIP ROT 10 11.4 M/L (STAPLE) ×3
BAG RETRIEVAL 10 (BASKET) ×1
BAG RETRIEVAL 10MM (BASKET) ×1
CHLORAPREP W/TINT 26 (MISCELLANEOUS) ×3 IMPLANT
CLIP APPLIE ROT 10 11.4 M/L (STAPLE) ×1 IMPLANT
CLOTH BEACON ORANGE TIMEOUT ST (SAFETY) ×3 IMPLANT
COVER LIGHT HANDLE STERIS (MISCELLANEOUS) ×6 IMPLANT
COVER WAND RF STERILE (DRAPES) ×3 IMPLANT
DERMABOND ADVANCED (GAUZE/BANDAGES/DRESSINGS) ×2
DERMABOND ADVANCED .7 DNX12 (GAUZE/BANDAGES/DRESSINGS) ×1 IMPLANT
ELECT REM PT RETURN 9FT ADLT (ELECTROSURGICAL) ×3
ELECTRODE REM PT RTRN 9FT ADLT (ELECTROSURGICAL) ×1 IMPLANT
GLOVE BIOGEL PI IND STRL 7.0 (GLOVE) ×2 IMPLANT
GLOVE BIOGEL PI INDICATOR 7.0 (GLOVE) ×4
GLOVE SURG SS PI 7.5 STRL IVOR (GLOVE) ×3 IMPLANT
GOWN STRL REUS W/TWL LRG LVL3 (GOWN DISPOSABLE) ×9 IMPLANT
HEMOSTAT SNOW SURGICEL 2X4 (HEMOSTASIS) ×3 IMPLANT
INST SET LAPROSCOPIC AP (KITS) ×3 IMPLANT
KIT TURNOVER KIT A (KITS) ×3 IMPLANT
MANIFOLD NEPTUNE II (INSTRUMENTS) ×3 IMPLANT
NDL HYPO 18GX1.5 BLUNT FILL (NEEDLE) ×1 IMPLANT
NDL INSUFFLATION 14GA 120MM (NEEDLE) ×1 IMPLANT
NEEDLE HYPO 18GX1.5 BLUNT FILL (NEEDLE) ×3 IMPLANT
NEEDLE HYPO 22GX1.5 SAFETY (NEEDLE) ×3 IMPLANT
NEEDLE INSUFFLATION 14GA 120MM (NEEDLE) ×3 IMPLANT
NS IRRIG 1000ML POUR BTL (IV SOLUTION) ×3 IMPLANT
PACK LAP CHOLE LZT030E (CUSTOM PROCEDURE TRAY) ×3 IMPLANT
PAD ARMBOARD 7.5X6 YLW CONV (MISCELLANEOUS) ×3 IMPLANT
PENCIL HANDSWITCHING (ELECTRODE) ×2 IMPLANT
SET BASIN LINEN APH (SET/KITS/TRAYS/PACK) ×3 IMPLANT
SET TUBE SMOKE EVAC HIGH FLOW (TUBING) ×3 IMPLANT
SLEEVE ENDOPATH XCEL 5M (ENDOMECHANICALS) ×3 IMPLANT
SUT MNCRL AB 4-0 PS2 18 (SUTURE) ×6 IMPLANT
SUT VICRYL 0 UR6 27IN ABS (SUTURE) ×3 IMPLANT
SYR 20ML LL LF (SYRINGE) ×6 IMPLANT
SYS BAG RETRIEVAL 10MM (BASKET) ×1
SYSTEM BAG RETRIEVAL 10MM (BASKET) ×1 IMPLANT
TROCAR ENDO BLADELESS 11MM (ENDOMECHANICALS) ×3 IMPLANT
TROCAR XCEL NON-BLD 5MMX100MML (ENDOMECHANICALS) ×3 IMPLANT
TROCAR XCEL UNIV SLVE 11M 100M (ENDOMECHANICALS) ×3 IMPLANT
TUBE CONNECTING 12'X1/4 (SUCTIONS) ×1
TUBE CONNECTING 12X1/4 (SUCTIONS) ×2 IMPLANT
WARMER LAPAROSCOPE (MISCELLANEOUS) ×3 IMPLANT

## 2020-05-15 NOTE — Progress Notes (Signed)
Out of sequence, 15:40pm, Patient Husband Jackie Powell notified Dr. Lovell Sheehan adding zofran for nausea and changing prescription to [facility] , [place].

## 2020-05-15 NOTE — Interval H&P Note (Signed)
History and Physical Interval Note:  05/15/2020 7:58 AM  Jackie Powell  has presented today for surgery, with the diagnosis of Cholelithiasis.  The various methods of treatment have been discussed with the patient and family. After consideration of risks, benefits and other options for treatment, the patient has consented to  Procedure(s): LAPAROSCOPIC CHOLECYSTECTOMY (N/A) as a surgical intervention.  The patient's history has been reviewed, patient examined, no change in status, stable for surgery.  I have reviewed the patient's chart and labs.  Questions were answered to the patient's satisfaction.     Franky Macho

## 2020-05-15 NOTE — Anesthesia Preprocedure Evaluation (Signed)
Anesthesia Evaluation  Patient identified by MRN, date of birth, ID band Patient awake    Reviewed: Allergy & Precautions, NPO status , Patient's Chart, lab work & pertinent test results  History of Anesthesia Complications Negative for: history of anesthetic complications  Airway Mallampati: I  TM Distance: >3 FB Neck ROM: Full    Dental  (+) Dental Advisory Given, Teeth Intact   Pulmonary neg pulmonary ROS,    Pulmonary exam normal breath sounds clear to auscultation       Cardiovascular Exercise Tolerance: Good Normal cardiovascular exam Rhythm:Regular Rate:Normal     Neuro/Psych negative neurological ROS  negative psych ROS   GI/Hepatic GERD  Medicated,Chronic cholecystitis    Endo/Other  negative endocrine ROS  Renal/GU negative Renal ROS     Musculoskeletal   Abdominal   Peds  Hematology  (+) anemia ,   Anesthesia Other Findings   Reproductive/Obstetrics                          Anesthesia Physical Anesthesia Plan  ASA: II  Anesthesia Plan: General   Post-op Pain Management:    Induction: Intravenous  PONV Risk Score and Plan: 4 or greater and Ondansetron, Dexamethasone, Midazolam and Scopolamine patch - Pre-op  Airway Management Planned: Oral ETT  Additional Equipment:   Intra-op Plan:   Post-operative Plan: Extubation in OR  Informed Consent: I have reviewed the patients History and Physical, chart, labs and discussed the procedure including the risks, benefits and alternatives for the proposed anesthesia with the patient or authorized representative who has indicated his/her understanding and acceptance.     Dental advisory given  Plan Discussed with: CRNA  Anesthesia Plan Comments:         Anesthesia Quick Evaluation

## 2020-05-15 NOTE — Progress Notes (Addendum)
Husband Jorja Loa called from [phone] after discharge at 1535 pm, dicating pain prescription called to pharmacy in [place]. Advised husband that the request for additional nausea medication will be discussed when Dr. Lovell Sheehan notified of pain medication to different pharmacy.

## 2020-05-15 NOTE — Progress Notes (Signed)
At discharge, patient requesting prescription for additional nausea medication once seated inside the car. Advised patient and husband will discuss with Dr. Lovell Sheehan and call her back.

## 2020-05-15 NOTE — Progress Notes (Signed)
Out of sequence, 15:37pm. Dr. Lovell Sheehan notified of need for additional nausea medication and the pain medication prescription went to [facility] instead of [facility] in [place].  Dr. Lovell Sheehan verbalized he will resend pain prescription and add Zofran 4mg .

## 2020-05-15 NOTE — Anesthesia Procedure Notes (Signed)
Procedure Name: Intubation Date/Time: 05/15/2020 10:48 AM Performed by: Ollen Bowl, CRNA Pre-anesthesia Checklist: Patient identified, Patient being monitored, Timeout performed, Emergency Drugs available and Suction available Patient Re-evaluated:Patient Re-evaluated prior to induction Oxygen Delivery Method: Circle system utilized Preoxygenation: Pre-oxygenation with 100% oxygen Induction Type: IV induction Ventilation: Mask ventilation without difficulty Laryngoscope Size: Mac and 3 Grade View: Grade I Tube type: Oral Tube size: 7.0 mm Number of attempts: 1 Airway Equipment and Method: Stylet Placement Confirmation: ETT inserted through vocal cords under direct vision,  positive ETCO2 and breath sounds checked- equal and bilateral Secured at: 21 cm Tube secured with: Tape Dental Injury: Teeth and Oropharynx as per pre-operative assessment

## 2020-05-15 NOTE — Discharge Instructions (Signed)
PLEASE Jackie Powell UNTIL Friday  May 19, 2020. DO NOT USE ANY NUMBING MEDICATIONS WITHOUT CONSULTING A PHYSICIAN UNTIL AFTER Friday  June 11,2021 REMOVE THE SCOPOLAMINE PATCH BEHIND YOUR RIGHT EAR ON Wednesday. [facility] HANDS AFTER REMOVAL        Laparoscopic Cholecystectomy, Care After This sheet gives you information about how to care for yourself after your procedure. Your health care provider may also give you more specific instructions. If you have problems or questions, contact your health care provider. What can I expect after the procedure? After the procedure, it is common to have:  Pain at your incision sites. You will be given medicines to control this pain.  Mild nausea or vomiting.  Bloating and possible shoulder pain from the air-like gas that was used during the procedure. Follow these instructions at home: Incision care   Follow instructions from your health care provider about how to take care of your incisions. Make sure you: ? Wash your hands with soap and water before you change your bandage (dressing). If soap and water are not available, use hand sanitizer. ? Change your dressing as told by your health care provider. ? Leave stitches (sutures), skin glue, or adhesive strips in place. These skin closures may need to be in place for 2 weeks or longer. If adhesive strip edges start to loosen and curl up, you may trim the loose edges. Do not remove adhesive strips completely unless your health care provider tells you to do that.  Do not take baths, swim, or use a hot tub until your health care provider approves. Ask your health care provider if you can take showers. You may only be allowed to take sponge baths for bathing.  Check your incision area every day for signs of infection. Check for: ? More redness, swelling, or pain. ? More fluid or blood. ? Warmth. ? Pus or a bad smell. Activity  Do not drive or use heavy machinery while taking prescription  pain medicine.  Do not lift anything that is heavier than 10 lb (4.5 kg) until your health care provider approves.  Do not play contact sports until your health care provider approves.  Do not drive for 24 hours if you were given a medicine to help you relax (sedative).  Rest as needed. Do not return to work or school until your health care provider approves. General instructions  Take over-the-counter and prescription medicines only as told by your health care provider.  To prevent or treat constipation while you are taking prescription pain medicine, your health care provider may recommend that you: ? Drink enough fluid to keep your urine clear or pale yellow. ? Take over-the-counter or prescription medicines. ? Eat foods that are high in fiber, such as fresh fruits and vegetables, whole grains, and beans. ? Limit foods that are high in fat and processed sugars, such as fried and sweet foods. Contact a health care provider if:  You develop a rash.  You have more redness, swelling, or pain around your incisions.  You have more fluid or blood coming from your incisions.  Your incisions feel warm to the touch.  You have pus or a bad smell coming from your incisions.  You have a fever.  One or more of your incisions breaks open. Get help right away if:  You have trouble breathing.  You have chest pain.  You have increasing pain in your shoulders.  You faint or feel dizzy when you stand.  You  have severe pain in your abdomen.  You have nausea or vomiting that lasts for more than one day.  You have leg pain. This information is not intended to replace advice given to you by your health care provider. Make sure you discuss any questions you have with your health care provider. Document Revised: 11/07/2017 Document Reviewed: 05/13/2016 Elsevier Patient Education  2020 [place] Anesthesia, Adult, Care After This sheet gives you information about  how to care for yourself after your procedure. Your health care provider may also give you more specific instructions. If you have problems or questions, contact your health care provider. What can I expect after the procedure? After the procedure, the following side effects are common:  Pain or discomfort at the IV site.  Nausea.  Vomiting.  Sore throat.  Trouble concentrating.  Feeling cold or chills.  Weak or tired.  Sleepiness and fatigue.  Soreness and body aches. These side effects can affect parts of the body that were not involved in surgery. Follow these instructions at home:  For at least 24 hours after the procedure:  Have a responsible adult stay with you. It is important to have someone help care for you until you are awake and alert.  Rest as needed.  Do not: ? Participate in activities in which you could fall or become injured. ? Drive. ? Use heavy machinery. ? Drink alcohol. ? Take sleeping pills or medicines that cause drowsiness. ? Make important decisions or sign legal documents. ? Take care of children on your own. Eating and drinking  Follow any instructions from your health care provider about eating or drinking restrictions.  When you feel hungry, start by eating small amounts of foods that are soft and easy to digest (bland), such as toast. Gradually return to your regular diet.  Drink enough fluid to keep your urine pale yellow.  If you vomit, rehydrate by drinking water, juice, or clear broth. General instructions  If you have sleep apnea, surgery and certain medicines can increase your risk for breathing problems. Follow instructions from your health care provider about wearing your sleep device: ? Anytime you are sleeping, including during daytime naps. ? While taking prescription pain medicines, sleeping medicines, or medicines that make you drowsy.  Return to your normal activities as told by your health care provider. Ask your health  care provider what activities are safe for you.  Take over-the-counter and prescription medicines only as told by your health care provider.  If you smoke, do not smoke without supervision.  Keep all follow-up visits as told by your health care provider. This is important. Contact a health care provider if:  You have nausea or vomiting that does not get better with medicine.  You cannot eat or drink without vomiting.  You have pain that does not get better with medicine.  You are unable to pass urine.  You develop a skin rash.  You have a fever.  You have redness around your IV site that gets worse. Get help right away if:  You have difficulty breathing.  You have chest pain.  You have blood in your urine or stool, or you vomit blood. Summary  After the procedure, it is common to have a sore throat or nausea. It is also common to feel tired.  Have a responsible adult stay with you for the first 24 hours after general anesthesia. It is important to have someone help care for you until  you are awake and alert.  When you feel hungry, start by eating small amounts of foods that are soft and easy to digest (bland), such as toast. Gradually return to your regular diet.  Drink enough fluid to keep your urine pale yellow.  Return to your normal activities as told by your health care provider. Ask your health care provider what activities are safe for you. This information is not intended to replace advice given to you by your health care provider. Make sure you discuss any questions you have with your health care provider. Document Revised: 11/28/2017 Document Reviewed: 07/11/2017 Elsevier Patient Education  [place].

## 2020-05-15 NOTE — Transfer of Care (Signed)
Immediate Anesthesia Transfer of Care Note  Patient: Jackie Powell  Procedure(s) Performed: LAPAROSCOPIC CHOLECYSTECTOMY (N/A Abdomen)  Patient Location: PACU  Anesthesia Type:General  Level of Consciousness: awake  Airway & Oxygen Therapy: Patient Spontanous Breathing  Post-op Assessment: Report given to RN  Post vital signs: Reviewed and stable  Last Vitals:  Vitals Value Taken Time  BP 140/66 05/15/20 1146  Temp    Pulse 61 05/15/20 1146  Resp 18 05/15/20 1146  SpO2 100 % 05/15/20 1146  Vitals shown include unvalidated device data.  Last Pain:  Vitals:   05/15/20 0752  TempSrc: Oral      Patients Stated Pain Goal: 9 (05/15/20 0752)  Complications: No apparent anesthesia complications

## 2020-05-15 NOTE — Anesthesia Postprocedure Evaluation (Signed)
Anesthesia Post Note  Patient: Jackie Powell  Procedure(s) Performed: LAPAROSCOPIC CHOLECYSTECTOMY (N/A Abdomen)  Patient location during evaluation: PACU Anesthesia Type: General Level of consciousness: awake and alert and oriented Pain management: pain level controlled Vital Signs Assessment: post-procedure vital signs reviewed and stable Respiratory status: spontaneous breathing Cardiovascular status: stable and blood pressure returned to baseline Postop Assessment: no apparent nausea or vomiting Anesthetic complications: no     Last Vitals:  Vitals:   05/15/20 0752 05/15/20 1146  BP: (!) 151/71 140/66  Pulse: 64 61  Resp: 17 18  Temp: 36.8 C 36.6 C  SpO2: 99% 100%    Last Pain:  Vitals:   05/15/20 0752  TempSrc: Oral                 Tiann Saha

## 2020-05-15 NOTE — Op Note (Signed)
Patient:  Jackie Powell  DOB:  [birth year]  MRN:  [id number]   Preop Diagnosis: Biliary colic, cholelithiasis  Postop Diagnosis: Same  Procedure: Laparoscopic cholecystectomy  Surgeon: Franky Macho, MD  Anes: General endotracheal  Indications: Patient is a 59 year old white female who presents with biliary colic secondary to cholelithiasis.  The risks and benefits of the procedure including bleeding, infection, hepatobiliary injury, and the possibility of an open procedure were fully explained to the patient, who gave informed consent.  Procedure note: The patient was placed in the supine position.  After induction of general endotracheal anesthesia, the abdomen was prepped and draped using the usual sterile technique with ChloraPrep.  Surgical site confirmation was performed.  A supraumbilical incision was made down to the fascia.  A Veress needle was introduced into the abdominal cavity and confirmation of placement was done using the saline drop test.  The abdomen was then insufflated to 15 mmHg pressure.  An 11 mm trocar was introduced into the abdominal cavity under direct visualization without difficulty.  The patient was placed in reverse Trendelenburg position and an additional 11 mm trocar was placed in the epigastric region and 5 mm trochars were placed the right upper quadrant and right flank regions.  Liver was inspected and noted to be within normal limits.  The gallbladder was retracted in a dynamic fashion in order to provide a critical view of the triangle of Calot.  The cystic duct was first identified.  Its juncture to the infundibulum was fully identified.  Endoclips were placed proximally and distally on cystic duct, and the cystic duct was divided.  This was likewise done to the cystic artery.  The gallbladder was freed away from the gallbladder fossa using Bovie electrocautery.  The gallbladder was delivered through the epigastric trocar site using an Endo Catch bag.  The  gallbladder fossa was inspected and no abnormal bleeding or bile leakage was noted.  Surgicel was placed in the gallbladder fossa.  All fluid and air were then evacuated from the abdominal cavity prior to removal of the trochars.  All wounds were irrigated normal saline.  All wounds were injected with Exparel.  The supraumbilical fascia was reapproximated using an 0 Vicryl interrupted suture.  All skin incisions were closed using staples.  Betadine ointment and dry sterile dressings were applied.  All tape and needle counts were correct at the end of the procedure.  The patient was extubated in the operating room and transferred to PACU in stable condition.  Complications: None  EBL: Minimal  Specimen: Gallbladder

## 2020-05-16 LAB — SURGICAL PATHOLOGY

## 2020-05-19 ENCOUNTER — Telehealth: Payer: Self-pay | Admitting: Family Medicine

## 2020-05-19 NOTE — Telephone Encounter (Signed)
Pt called and states that she is having some constipation and the Miralax is not helping. She also c/o of some nausea but has not taken the Zofran at all.  Recommendations to pt as follows: Miralax q 4 hours with water until movement. If not bm in 2 days may try some dulcolax. Also can take the nausea medication as needed. Pt verbalized understanding.

## 2020-06-01 ENCOUNTER — Ambulatory Visit: Payer: Self-pay | Admitting: General Surgery

## 2020-06-08 ENCOUNTER — Ambulatory Visit: Payer: Self-pay | Admitting: General Surgery

## 2020-06-13 ENCOUNTER — Encounter: Payer: Self-pay | Admitting: General Surgery

## 2020-06-13 ENCOUNTER — Other Ambulatory Visit: Payer: Self-pay

## 2020-06-13 ENCOUNTER — Ambulatory Visit (INDEPENDENT_AMBULATORY_CARE_PROVIDER_SITE_OTHER): Payer: Self-pay | Admitting: General Surgery

## 2020-06-13 VITALS — BP 156/85 | HR 82 | Temp 98.1°F | Resp 16 | Ht 64.0 in | Wt 128.0 lb

## 2020-06-13 DIAGNOSIS — Z09 Encounter for follow-up examination after completed treatment for conditions other than malignant neoplasm: Secondary | ICD-10-CM

## 2020-06-14 NOTE — Progress Notes (Signed)
Subjective:     Jackie Powell  Patient is here for follow-up, status post laparoscopic cholecystectomy.  She states she is still losing some weight which is very concerning to her.  She is very active.  She just does not have an appetite.  She does still have some nausea, though it is less.  She is constipated but this is been a chronic problem for her.  She denies any fever, chills, or abdominal pain. Objective:    BP (!) 156/85   Pulse 82   Temp 98.1 F (36.7 C) (Temporal)   Resp 16   Ht 5\' 4"  (1.626 m)   Wt 128 lb (58.1 kg)   LMP 06/17/2011   SpO2 97%   BMI 21.97 kg/m   General:  alert, cooperative and no distress  Abdomen is soft, incisions healing well. Final pathology consistent with diagnosis.     Assessment:    Doing well from surgical standpoint.  Patient is very concerned about her weight loss and becomes teary-eyed during her conversation.  I did explain that gallbladder removal does not affect metabolism.  She has had a lot of stressors in her life and this may be affecting her p.o. intake.    Plan:   Given the stressors in her life, I strongly encouraged her to see her primary care physician for consideration of antidepressants/anxiolytics.  She understands this.  This was discussed with her husband in the room.  Follow-up with me as needed.

## 2020-12-10 ENCOUNTER — Encounter (HOSPITAL_COMMUNITY): Payer: Self-pay | Admitting: Emergency Medicine

## 2020-12-10 ENCOUNTER — Emergency Department (HOSPITAL_COMMUNITY)
Admission: EM | Admit: 2020-12-10 | Discharge: 2020-12-10 | Disposition: A | Discharge status: Home / Self Care | Payer: PRIVATE HEALTH INSURANCE | Attending: Emergency Medicine | Admitting: Emergency Medicine

## 2020-12-10 ENCOUNTER — Other Ambulatory Visit: Payer: Self-pay

## 2020-12-10 ENCOUNTER — Emergency Department (HOSPITAL_COMMUNITY): Payer: PRIVATE HEALTH INSURANCE

## 2020-12-10 DIAGNOSIS — R109 Unspecified abdominal pain: Secondary | ICD-10-CM | POA: Diagnosis present

## 2020-12-10 DIAGNOSIS — K5792 Diverticulitis of intestine, part unspecified, without perforation or abscess without bleeding: Secondary | ICD-10-CM

## 2020-12-10 DIAGNOSIS — K219 Gastro-esophageal reflux disease without esophagitis: Secondary | ICD-10-CM | POA: Diagnosis not present

## 2020-12-10 DIAGNOSIS — K5732 Diverticulitis of large intestine without perforation or abscess without bleeding: Secondary | ICD-10-CM | POA: Insufficient documentation

## 2020-12-10 LAB — CBC
HCT: 40.6 % (ref 36.0–46.0)
Hemoglobin: 13.4 g/dL (ref 12.0–15.0)
MCH: 30.9 pg (ref 26.0–34.0)
MCHC: 33 g/dL (ref 30.0–36.0)
MCV: 93.5 fL (ref 80.0–100.0)
Platelets: 250 10*3/uL (ref 150–400)
RBC: 4.34 MIL/uL (ref 3.87–5.11)
RDW: 12.6 % (ref 11.5–15.5)
WBC: 13.8 10*3/uL — ABNORMAL HIGH (ref 4.0–10.5)
nRBC: 0 % (ref 0.0–0.2)

## 2020-12-10 LAB — URINALYSIS, ROUTINE W REFLEX MICROSCOPIC
Bilirubin Urine: NEGATIVE
Glucose, UA: NEGATIVE mg/dL
Ketones, ur: NEGATIVE mg/dL
Leukocytes,Ua: NEGATIVE
Nitrite: NEGATIVE
Protein, ur: NEGATIVE mg/dL
Specific Gravity, Urine: 1.012 (ref 1.005–1.030)
pH: 5 (ref 5.0–8.0)

## 2020-12-10 LAB — COMPREHENSIVE METABOLIC PANEL
ALT: 16 U/L (ref 0–44)
AST: 17 U/L (ref 15–41)
Albumin: 4.1 g/dL (ref 3.5–5.0)
Alkaline Phosphatase: 47 U/L (ref 38–126)
Anion gap: 9 (ref 5–15)
BUN: 8 mg/dL (ref 6–20)
CO2: 25 mmol/L (ref 22–32)
Calcium: 9 mg/dL (ref 8.9–10.3)
Chloride: 104 mmol/L (ref 98–111)
Creatinine, Ser: 0.64 mg/dL (ref 0.44–1.00)
GFR, Estimated: >60 mL/min (ref >60)
Glucose, Bld: 110 mg/dL — ABNORMAL HIGH (ref 70–99)
Potassium: 3.5 mmol/L (ref 3.5–5.1)
Sodium: 138 mmol/L (ref 135–145)
Total Bilirubin: 1 mg/dL (ref 0.3–1.2)
Total Protein: 7.4 g/dL (ref 6.5–8.1)

## 2020-12-10 LAB — LIPASE, BLOOD: Lipase: 23 U/L (ref 11–51)

## 2020-12-10 MED ORDER — IOHEXOL 300 MG/ML  SOLN
100.0000 mL | Freq: Once | INTRAMUSCULAR | Status: AC | PRN
  Administered 2020-12-10: 100 mL via INTRAVENOUS

## 2020-12-10 MED ORDER — AMOXICILLIN-POT CLAVULANATE 875-125 MG PO TABS
1.0000 | ORAL_TABLET | Freq: Two times a day (BID) | ORAL | 0 refills | Status: AC
Start: 2020-12-10 — End: ?

## 2020-12-10 NOTE — ED Provider Notes (Signed)
[facility] EMERGENCY DEPARTMENT Provider Note   CSN: [id number] Arrival date & time: 12/10/20  1347     History Chief Complaint  Patient presents with  . Abdominal Pain    Jackie Powell is a 60 y.o. female.  HPI Patient presents with abdominal pain.  Began last night.  Somewhat crampy.  Almost felt like a menses.  However had a urinary frequency.  States she urinated about 14 times last night.  Also had a bowel movement that was mucousy and had a little blood.  Pain is improving somewhat now.  No fevers.  No nausea or vomiting.  Not been around anyone sick.  Has had her Covid vaccine.  No flank pain.    Past Medical History:  Diagnosis Date  . Allergy   . Anemia   . B12 deficiency   . GERD (gastroesophageal reflux disease)   . Oral candidiasis     Patient Active Problem List   Diagnosis Date Noted  . Calculus of gallbladder without cholecystitis without obstruction   . Appendicitis 06/20/2014  . CANDIDIASIS, ORAL 04/26/2010  . ANEMIA, IRON DEFICIENCY 04/26/2010  . ALLERGIC RHINITIS, SEASONAL 04/26/2010  . GERD 04/26/2010  . DYSPHAGIA 04/26/2010    Past Surgical History:  Procedure Laterality Date  . APPENDECTOMY    . CHOLECYSTECTOMY N/A 05/15/2020   Procedure: LAPAROSCOPIC CHOLECYSTECTOMY;  Surgeon: Aviva Signs, MD;  Location: [facility] ORS;  Service: General;  Laterality: N/A;  . LAPAROSCOPIC APPENDECTOMY N/A 06/19/2014   Procedure: APPENDECTOMY LAPAROSCOPIC;  Surgeon: Shann Medal, MD;  Location: [facility] ORS;  Service: General;  Laterality: N/A;  . UPPER GASTROINTESTINAL ENDOSCOPY     esophageal web     OB History    Gravida  3   Para  3   Term  3   Preterm      AB      Living  3     SAB      IAB      Ectopic      Multiple      Live Births              Family History  Problem Relation Age of Onset  . Colon cancer Father   . Heart disease Maternal Grandmother     Social History   Tobacco Use  . Smoking status: Never Smoker  . Smokeless  tobacco: Never Used  Vaping Use  . Vaping Use: Never used  Substance Use Topics  . Alcohol use: No  . Drug use: No    Home Medications Prior to Admission medications   Medication Sig Start Date End Date Taking? Authorizing Provider  amoxicillin-clavulanate (AUGMENTIN) 875-125 MG tablet Take 1 tablet by mouth every 12 (twelve) hours. 12/10/20  Yes Davonna Belling, MD  calcium carbonate (TUMS - DOSED IN MG ELEMENTAL CALCIUM) 500 MG chewable tablet Chew 1-2 tablets by mouth 3 (three) times daily as needed for indigestion or heartburn.    [provider]  oxymetazoline (AFRIN) 0.05 % nasal spray Place 1 spray into both nostrils 2 (two) times daily as needed for congestion.    [provider]  tetrahydrozoline 0.05 % ophthalmic solution Place 1 drop into both eyes 3 (three) times daily as needed (allergy eyes.).    [provider]  hyoscyamine (ANASPAZ) 0.125 MG TBDP disintergrating tablet Place 0.125 mg under the tongue every 4 (four) hours as needed for cramping.  10/21/19  [provider]  omeprazole (PRILOSEC) 20 MG capsule Take 1 capsule (20 mg  total) by mouth daily. 06/17/11 10/21/19  Hart Carwin, MD  promethazine (PHENERGAN) 25 MG tablet Take 25 mg by mouth every 4 (four) hours as needed for nausea or vomiting.  10/21/19  [provider]    Allergies    Azithromycin and Codeine  Review of Systems   Review of Systems  Constitutional: Negative for appetite change, fatigue and fever.  HENT: Negative for congestion.   Respiratory: Negative for shortness of breath.   Cardiovascular: Negative for chest pain.  Gastrointestinal: Positive for abdominal pain.  Genitourinary: Positive for frequency.  Musculoskeletal: Negative for arthralgias.  Skin: Negative for rash.  Neurological: Negative for weakness.  Psychiatric/Behavioral: Negative for confusion.    Physical Exam Updated Vital Signs BP (!) 154/69 (BP Location: Left Arm)   Pulse 76    Temp 98.2 F (36.8 C) (Oral)   Resp 16   Ht 5\' 4"  (1.626 m)   Wt 59 kg   LMP 06/17/2011   SpO2 100%   BMI 22.31 kg/m   Physical Exam Vitals and nursing note reviewed.  Constitutional:      Appearance: She is well-developed.  HENT:     Head: Atraumatic.  Eyes:     General: No scleral icterus. Cardiovascular:     Rate and Rhythm: Normal rate and regular rhythm.  Pulmonary:     Breath sounds: No wheezing or rhonchi.  Abdominal:     Hernia: No hernia is present.     Comments: Lower abdominal tenderness without rebound or guarding.  No hernia palpated.  May be some fullness in the area.  Skin:    General: Skin is warm.     Capillary Refill: Capillary refill takes less than 2 seconds.  Neurological:     Mental Status: She is alert and oriented to person, place, and time.     ED Results / Procedures / Treatments   Labs (all labs ordered are listed, but only abnormal results are displayed) Labs Reviewed  COMPREHENSIVE METABOLIC PANEL - Abnormal; Notable for the following components:      Result Value   Glucose, Bld 110 (*)    All other components within normal limits  CBC - Abnormal; Notable for the following components:   WBC 13.8 (*)    All other components within normal limits  URINALYSIS, ROUTINE W REFLEX MICROSCOPIC - Abnormal; Notable for the following components:   Hgb urine dipstick MODERATE (*)    Bacteria, UA RARE (*)    All other components within normal limits  LIPASE, BLOOD    EKG None  Radiology CT ABDOMEN PELVIS W CONTRAST  Result Date: 12/10/2020 CLINICAL DATA:  60 year old female with history of left lower quadrant abdominal pain. Suspected acute diverticulitis. EXAM: CT ABDOMEN AND PELVIS WITH CONTRAST TECHNIQUE: Multidetector CT imaging of the abdomen and pelvis was performed using the standard protocol following bolus administration of intravenous contrast. CONTRAST:  41 OMNIPAQUE IOHEXOL 300 MG/ML  SOLN COMPARISON:  CT the abdomen and pelvis  06/20/2014. FINDINGS: Lower chest: Unremarkable. Hepatobiliary: No suspicious cystic or solid hepatic lesions. No intra or extrahepatic biliary ductal dilatation. Status post cholecystectomy. Pancreas: No pancreatic mass. No pancreatic ductal dilatation. No pancreatic or peripancreatic fluid collections or inflammatory changes. Spleen: Unremarkable. Adrenals/Urinary Tract: Bilateral kidneys and adrenal glands are normal in appearance. No hydroureteronephrosis. Urinary bladder is nearly decompressed, but otherwise normal in appearance. Stomach/Bowel: The appearance of the stomach is normal. There is no pathologic dilatation of small bowel or colon. The appendix is not confidently identified and  may be surgically absent. Regardless, there are no inflammatory changes noted adjacent to the cecum to suggest the presence of an acute appendicitis at this time. Numerous colonic diverticulae are noted. In the sigmoid colon there are significant surrounding inflammatory changes indicative of an acute diverticulitis at this time. No discrete diverticular abscess confidently identified. No definite evidence of frank perforation. Vascular/Lymphatic: No significant atherosclerotic disease, aneurysm or dissection noted in the abdominal or pelvic vasculature. No lymphadenopathy noted in the abdomen or pelvis. Reproductive: Uterus and ovaries are unremarkable in appearance. Other: Small volume of ascites.  No pneumoperitoneum. Musculoskeletal: There are no aggressive appearing lytic or blastic lesions noted in the visualized portions of the skeleton. IMPRESSION: 1. Findings are compatible with an acute diverticulitis in the sigmoid colon. No definitive evidence of diverticular abscess or signs of frank perforation are noted at this time. 2. Small volume of ascites in the low anatomic pelvis, likely reactive. 3. Additional incidental findings, as above. Electronically Signed   By: Trudie Reed M.D.   On: 12/10/2020 17:56     Procedures Procedures (including critical care time)  Medications Ordered in ED Medications  iohexol (OMNIPAQUE) 300 MG/ML solution 100 mL (100 mLs Intravenous Contrast Given 12/10/20 1746)    ED Course  I have reviewed the triage vital signs and the nursing notes.  Pertinent labs & imaging results that were available during my care of the patient were reviewed by me and considered in my medical decision making (see chart for details).    MDM Rules/Calculators/A&P                          Patient with abdominal pain.  Some urinary symptoms.  No nausea or vomiting.  Did have some blood in the stool mucousy stool.  White count mildly elevated but well-appearing.  Rather benign exam.  CT scan shows uncomplicated diverticulitis.  I think patient is a good candidate for oral treatment.  Will discharge home.  I have reviewed lab work and imaging.  Final Clinical Impression(s) / ED Diagnoses Final diagnoses:  Diverticulitis    Rx / DC Orders ED Discharge Orders         Ordered    amoxicillin-clavulanate (AUGMENTIN) 875-125 MG tablet  Every 12 hours        12/10/20 1850           Benjiman Core, MD 12/10/20 1856

## 2020-12-10 NOTE — ED Notes (Signed)
To CT

## 2020-12-10 NOTE — ED Triage Notes (Signed)
Patient c/o lower abd pain that started last night. Denies any diarrhea, nausea, vomiting, or fevers. Per patient urinary frequency.

## 2022-01-28 IMAGING — NM NM HEPATO W/GB/PHARM/[PERSON_NAME]
2 series · 12 of 12 positions shown · non-contrast
Comparison: None.

CLINICAL DATA: Abdominal pain and nausea.  Cholelithiasis.

EXAM:
NUCLEAR MEDICINE HEPATOBILIARY IMAGING WITH GALLBLADDER EF
TECHNIQUE: Sequential images of the abdomen were obtained [DATE] minutes
following intravenous administration of radiopharmaceutical. After
oral ingestion of Ensure, gallbladder ejection fraction was
determined. At 60 min, normal ejection fraction is greater than 33%.
RADIOPHARMACEUTICALS:  5.1 mCi Oc-11m  Choletec IV

[Series 1: biliary · 3.25mm/px · 6 of 60 frames shown]
[frame 6/60]
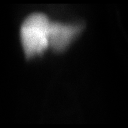
[frame 16/60]
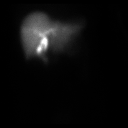
[frame 26/60]
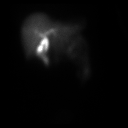
[frame 36/60]
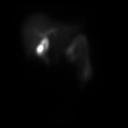
[frame 46/60]
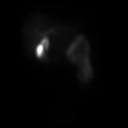
[frame 56/60]
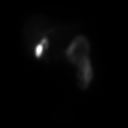

[Series 2: gbef · 3.25mm/px · 6 of 60 frames shown]
[frame 6/60]
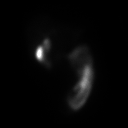
[frame 16/60]
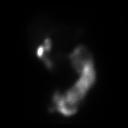
[frame 26/60]
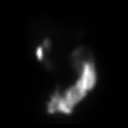
[frame 36/60]
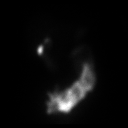
[frame 46/60]
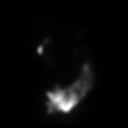
[frame 56/60]
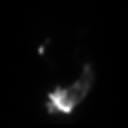

[12 of 12 positions shown; findings below may reference images not displayed]

FINDINGS: Prompt uptake and biliary excretion of activity by the liver is
seen. Gallbladder activity is visualized, consistent with patency of
cystic duct. Biliary activity passes into small bowel, consistent
with patent common bile duct.

Calculated gallbladder ejection fraction is 88%. (Normal gallbladder
ejection fraction with Ensure is greater than 33%.)
IMPRESSION: Normal hepatobiliary scan, demonstrating patency of cystic and
common bile ducts.

Normal gallbladder ejection fraction.
